# Patient Record
Sex: Female | Born: 1955 | Race: White | Hispanic: No | Smoking: Former smoker
Health system: Southern US, Community
[De-identification: ages and names within clinical notes are randomized; demographics above are authoritative.]

## PROBLEM LIST (undated history)

## (undated) VITALS — BP 114/72 | HR 87 | Temp 98.1°F | Resp 16 | Ht 65.0 in | Wt 193.0 lb

## (undated) DIAGNOSIS — I639 Cerebral infarction, unspecified: Secondary | ICD-10-CM

## (undated) DIAGNOSIS — F419 Anxiety disorder, unspecified: Secondary | ICD-10-CM

## (undated) DIAGNOSIS — F329 Major depressive disorder, single episode, unspecified: Secondary | ICD-10-CM

## (undated) DIAGNOSIS — F32A Depression, unspecified: Secondary | ICD-10-CM

## (undated) DIAGNOSIS — I10 Essential (primary) hypertension: Secondary | ICD-10-CM

## (undated) DIAGNOSIS — K259 Gastric ulcer, unspecified as acute or chronic, without hemorrhage or perforation: Secondary | ICD-10-CM

## (undated) DIAGNOSIS — E119 Type 2 diabetes mellitus without complications: Secondary | ICD-10-CM

## (undated) DIAGNOSIS — F1010 Alcohol abuse, uncomplicated: Secondary | ICD-10-CM

## (undated) DIAGNOSIS — R600 Localized edema: Secondary | ICD-10-CM

## (undated) HISTORY — PX: TONSILLECTOMY: SUR1361

## (undated) HISTORY — DX: Anxiety disorder, unspecified: F41.9

## (undated) HISTORY — PX: EYE SURGERY: SHX253

## (undated) HISTORY — DX: Alcohol abuse, uncomplicated: F10.10

## (undated) HISTORY — PX: TUBAL LIGATION: SHX77

## (undated) HISTORY — PX: BACK SURGERY: SHX140

## (undated) HISTORY — PX: GASTRIC BYPASS: SHX52

## (undated) HISTORY — PX: CHOLECYSTECTOMY: SHX55

---

## 2010-10-17 NOTE — Progress Notes (Signed)
Subjective:   Shirley Bass is a 55 y.o. female is s/p gastric bypass    Patient is here to have care resumed from a GBP that was done in 2008 by Dr. Mingo Amber.  Start wt 257 per pt  Total 92     She is exercising 3 times per week.    She is taking the post op recommended vitamins.    She is doing well and progressing nicely.    Daniel is eating 4 oz.   Satiety level is 5 hours.     Objective:   BP 116/68   Pulse 86   Temp 97.5 ??F (36.4 ??C)   Ht 5\' 5"  (1.651 m)   Wt 165 lb 8 oz (75.07 kg)   BMI 27.54 kg/m2   SpO2 98%   Estimated Body mass index is 27.54 kg/(m^2) as calculated from the following:    Height as of this encounter: 5\' 5" (1.651 m).    Weight as of this encounter: 165 lb 8 oz(75.07 kg).  Wt Readings from Last 3 Encounters:   10/17/10 165 lb 8 oz (75.07 kg)     Her change in weight since last visit: lost,  92 lbs.    Physical Examination: General appearance - alert, well appearing, and in no distress  Abdomen - soft, nontender, nondistended, no masses or organomegaly  scars from previous incisions well healed          Assessment/Plan:   Morbid Obesity, status gastric bypass surgery  Reviewed behaviors for compliance, including routine measuring of food volumes, Rarely or never eating until they are full.. The risk of pouch dilation secondary to this behavior was explained.      We will continue current treatment and  diet per protocol.  We have encourage attendance at support group and continuing an exercise program.  Will check labs  in 6 month(s)

## 2010-10-17 NOTE — Progress Notes (Signed)
Addended by: Raylene Miyamoto on: 10/17/2010 04:01 PM     Modules accepted: Level of Service

## 2010-10-17 NOTE — Patient Instructions (Signed)
An After Visit Summary was printed and given to the patient.

## 2010-10-17 NOTE — Progress Notes (Signed)
Patient is here to have care resumed from a GBP that was done in 2008 by Dr. Mingo Amber.  Start wt 257 per pt  Total 92

## 2011-08-20 LAB — CBC WITH AUTOMATED DIFF
ABS. BASOPHILS: 0 10*3/uL (ref 0.0–0.1)
ABS. EOSINOPHILS: 0.1 10*3/uL (ref 0.0–0.4)
ABS. LYMPHOCYTES: 2.9 10*3/uL (ref 0.8–3.5)
ABS. MONOCYTES: 0.7 10*3/uL (ref 0.0–1.0)
ABS. NEUTROPHILS: 5.6 10*3/uL (ref 1.8–8.0)
BASOPHILS: 0 % (ref 0–1)
EOSINOPHILS: 1 % (ref 0–7)
HCT: 49.1 % — ABNORMAL HIGH (ref 35.0–47.0)
HGB: 17.6 g/dL — ABNORMAL HIGH (ref 11.5–16.0)
LYMPHOCYTES: 31 % (ref 12–49)
MCH: 33.3 PG (ref 26.0–34.0)
MCHC: 35.8 g/dL (ref 30.0–36.5)
MCV: 92.8 FL (ref 80.0–99.0)
MONOCYTES: 7 % (ref 5–13)
NEUTROPHILS: 61 % (ref 32–75)
PLATELET: 298 10*3/uL (ref 150–400)
RBC: 5.29 M/uL — ABNORMAL HIGH (ref 3.80–5.20)
RDW: 12.6 % (ref 11.5–14.5)
WBC: 9.2 10*3/uL (ref 3.6–11.0)

## 2011-08-20 LAB — URINALYSIS W/ REFLEX CULTURE
Bacteria: NEGATIVE /HPF
Bilirubin: NEGATIVE
Blood: NEGATIVE
Glucose: 100 MG/DL — AB
Ketone: 15 MG/DL — AB
Leukocyte Esterase: NEGATIVE
Nitrites: NEGATIVE
Protein: NEGATIVE MG/DL
Specific gravity: 1.009 (ref 1.003–1.030)
Urobilinogen: 0.2 EU/DL (ref 0.2–1.0)
pH (UA): 5.5 (ref 5.0–8.0)

## 2011-08-20 LAB — METABOLIC PANEL, COMPREHENSIVE
A-G Ratio: 1 — ABNORMAL LOW (ref 1.1–2.2)
ALT (SGPT): 20 U/L (ref 12–78)
AST (SGOT): 15 U/L (ref 15–37)
Albumin: 4.3 g/dL (ref 3.5–5.0)
Alk. phosphatase: 101 U/L (ref 50–136)
Anion gap: 11 mmol/L (ref 5–15)
BUN/Creatinine ratio: 14 (ref 12–20)
BUN: 6 MG/DL (ref 6–20)
Bilirubin, total: 0.7 MG/DL (ref 0.2–1.0)
CO2: 23 MMOL/L (ref 21–32)
Calcium: 9.3 MG/DL (ref 8.5–10.1)
Chloride: 102 MMOL/L (ref 97–108)
Creatinine: 0.42 MG/DL — ABNORMAL LOW (ref 0.45–1.15)
GFR est AA: >60 mL/min/{1.73_m2} (ref >60)
GFR est non-AA: >60 mL/min/{1.73_m2} (ref >60)
Globulin: 4.2 g/dL — ABNORMAL HIGH (ref 2.0–4.0)
Glucose: 199 MG/DL — ABNORMAL HIGH (ref 65–100)
Potassium: 3.6 MMOL/L (ref 3.5–5.1)
Protein, total: 8.5 g/dL — ABNORMAL HIGH (ref 6.4–8.2)
Sodium: 136 MMOL/L (ref 136–145)

## 2011-08-20 LAB — AMYLASE: Amylase: 24 U/L — ABNORMAL LOW (ref 25–115)

## 2011-08-20 LAB — LIPASE: Lipase: 69 U/L — ABNORMAL LOW (ref 73–393)

## 2011-08-20 MED ORDER — ONDANSETRON HCL 8 MG TAB
8 mg | ORAL_TABLET | Freq: Three times a day (TID) | ORAL | Status: AC | PRN
Start: 2011-08-20 — End: ?

## 2011-08-20 MED ORDER — SODIUM CHLORIDE 0.9 % IJ SYRG
Freq: Once | INTRAMUSCULAR | Status: AC
Start: 2011-08-20 — End: 2011-08-20
  Administered 2011-08-20: 22:00:00 via INTRAVENOUS

## 2011-08-20 MED ORDER — SODIUM CHLORIDE 0.9% BOLUS IV
0.9 % | Freq: Once | INTRAVENOUS | Status: AC
Start: 2011-08-20 — End: 2011-08-20
  Administered 2011-08-20: 22:00:00 via INTRAVENOUS

## 2011-08-20 MED ORDER — HYDROCODONE-ACETAMINOPHEN 7.5 MG-325 MG TAB
ORAL_TABLET | Freq: Four times a day (QID) | ORAL | Status: AC | PRN
Start: 2011-08-20 — End: ?

## 2011-08-20 MED ORDER — IOVERSOL 350 MG/ML IV SOLN
350 mg iodine/mL | Freq: Once | INTRAVENOUS | Status: AC
Start: 2011-08-20 — End: 2011-08-20
  Administered 2011-08-20: 22:00:00 via INTRAVENOUS

## 2011-08-20 MED ORDER — ONDANSETRON 8 MG TAB, RAPID DISSOLVE
8 mg | ORAL_TABLET | Freq: Three times a day (TID) | ORAL | Status: AC | PRN
Start: 2011-08-20 — End: ?

## 2011-08-20 MED ADMIN — HYDROmorphone (DILAUDID) injection 2 mg: INTRAVENOUS | @ 23:00:00 | NDC 00641012121

## 2011-08-20 MED ADMIN — ondansetron (ZOFRAN) injection 8 mg: INTRAVENOUS | @ 21:00:00 | NDC 00409475503

## 2011-08-20 MED ADMIN — sodium chloride 0.9 % bolus infusion 1,000 mL: INTRAVENOUS | @ 21:00:00 | NDC 00409798309

## 2011-08-20 MED ADMIN — HYDROmorphone (PF) (DILAUDID) injection 1 mg: INTRAVENOUS | @ 21:00:00 | NDC 00409255201

## 2011-08-20 MED FILL — SODIUM CHLORIDE 0.9 % IV: INTRAVENOUS | Qty: 100

## 2011-08-20 MED FILL — OPTIRAY 350 MG IODINE/ML INTRAVENOUS SOLUTION: 350 mg iodine/mL | INTRAVENOUS | Qty: 100

## 2011-08-20 MED FILL — ONDANSETRON (PF) 4 MG/2 ML INJECTION: 4 mg/2 mL | INTRAMUSCULAR | Qty: 4

## 2011-08-20 MED FILL — HYDROMORPHONE 2 MG/ML INJECTION SOLUTION: 2 mg/mL | INTRAMUSCULAR | Qty: 1

## 2011-08-20 MED FILL — BD POSIFLUSH NORMAL SALINE 0.9 % INJECTION SYRINGE: INTRAMUSCULAR | Qty: 10

## 2011-08-20 MED FILL — SODIUM CHLORIDE 0.9 % IV: INTRAVENOUS | Qty: 1000

## 2011-08-20 MED FILL — HYDROMORPHONE (PF) 1 MG/ML IJ SOLN: 1 mg/mL | INTRAMUSCULAR | Qty: 1

## 2011-08-20 NOTE — ED Notes (Signed)
Bedside and Verbal shift change report given to Judeth Cornfield, Charity fundraiser (oncoming nurse) by Joselyn Glassman, RN (offgoing nurse).  Report given with SBAR, ED Summary, MAR and Recent Results. RN walked with pt to bathroom per request.

## 2011-08-20 NOTE — ED Notes (Signed)
Pt resting in bed in no apparent distress. Call bell within reach. Bed in low, locked position. Lights dimmed for comfort.

## 2011-08-20 NOTE — ED Notes (Signed)
Report received from Tyler, RN

## 2011-08-20 NOTE — ED Notes (Signed)
Pt states abdominal cramping & nausea. Pt ambulated to bathroom. Notified ER MD of pt complaints. ER MD has placed order for OTD zofran.

## 2011-08-20 NOTE — ED Notes (Signed)
Pt off floor to CT

## 2011-08-20 NOTE — ED Notes (Signed)
No change to patient assessment.  Denies pain at this time. Received discharge instructions from ER MD, verbalizes understanding.  Ambulatory upon discharge to waiting room to await ride from boyfriend.

## 2011-08-20 NOTE — ED Notes (Signed)
Pt resting in bed in no apparent distress. Call bell within reach. Bed in low, locked position.

## 2011-08-20 NOTE — ED Provider Notes (Signed)
HPI Comments: 56 y.o.female with PMH significant for diabetes, gastric bypass (4 years ago) and HTN presents with LUQ pain which radiates into left back.  The pt reports that her symptoms began at the beginning of the month and has been intermittent since its onsets.  She explained that when she wakes up in the morning the pain is not there but when she starts to "move around" the pain returns.  The pt was seen by her PCP Judge Stall, MD recently and she was told that she was constipated.  She started a regimen of laxatives with minimal relief of pain.  She explained that she is having regular BM's but her pain is continuing to get worse.  Her pain is exacerbated when she takes a deep breath. She also complains of worsening nausea over the last 2 days accompanied by loss of appetite.  Denies urinary symptoms, fever, chills, dyspnea, chest pain, melena, hematochezia or any other acute medical conditions.  Judge Stall, MD PCP.  Note written by Eliezer Champagne, Scribe, as dictated by Lovey Newcomer, MD 4:28 PM        The history is provided by the patient.        Past Medical History   Diagnosis Date   ??? Stool color black    ??? Depression    ??? Diabetes    ??? Hypertension         Past Surgical History   Procedure Date   ??? Hx orthopaedic    ??? Hx gastric bypass      2008   ??? Hx cesarean section    ??? Hx heent          Family History   Problem Relation Age of Onset   ??? Diabetes Mother    ??? Hypertension Mother    ??? Stroke Maternal Grandmother         History     Social History   ??? Marital Status: DIVORCED     Spouse Name: N/A     Number of Children: N/A   ??? Years of Education: N/A     Occupational History   ??? Not on file.     Social History Main Topics   ??? Smoking status: Former Smoker   ??? Smokeless tobacco: Not on file   ??? Alcohol Use: Yes   ??? Drug Use: No   ??? Sexually Active: Yes     Other Topics Concern   ??? Not on file     Social History Narrative   ??? No narrative on file                  ALLERGIES: Ambien and Sulfa  (sulfonamide antibiotics)      Review of Systems   Constitutional: Positive for appetite change. Negative for fever.   HENT: Negative for congestion and rhinorrhea.    Eyes: Negative for visual disturbance.   Respiratory: Negative for cough and shortness of breath.    Cardiovascular: Negative for chest pain.   Gastrointestinal: Positive for nausea and abdominal pain. Negative for vomiting and diarrhea.   Genitourinary: Negative for dysuria and hematuria.   Musculoskeletal: Positive for back pain.   Skin: Negative for rash.   Neurological: Negative for syncope, light-headedness and headaches.   Psychiatric/Behavioral: Negative for confusion.   All other systems reviewed and are negative.        Filed Vitals:    08/20/11 1528 08/20/11 1530   BP: 192/116 172/110  Pulse: 96 98   Temp: 98.4 ??F (36.9 ??C)    Resp: 20    Height: 5\' 5"  (1.651 m)    Weight: 79.379 kg (175 lb)    SpO2: 98%             Physical Exam   Nursing note and vitals reviewed.  Constitutional: She is oriented to person, place, and time. She appears well-developed and well-nourished. She appears distressed.   HENT:   Head: Normocephalic.   Eyes: Pupils are equal, round, and reactive to light.   Neck: Normal range of motion.   Cardiovascular: Normal rate and regular rhythm.    No murmur heard.  Pulmonary/Chest: Effort normal and breath sounds normal. No respiratory distress.   Abdominal: Soft. There is Tenderness: There is tenderness in both upper quadrants and epigastrium  with most tenderness in the left upper quadrant. .   Musculoskeletal: Normal range of motion. She exhibits no edema.   Neurological: She is alert and oriented to person, place, and time.   Skin: Skin is warm and dry.   Psychiatric: She has a normal mood and affect. Her behavior is normal.        MDM     Amount and/or Complexity of Data Reviewed:   Clinical lab tests:  Ordered and reviewed  Tests in the radiology section of CPT??:  Ordered and reviewed   Decide to obtain previous  medical records or to obtain history from someone other than the patient:  Yes   Review and summarize past medical records:  Yes   Discuss the patient with another provider:  Yes   Independant visualization of image, tracing, or specimen:  Yes  Risk of Significant Complications, Morbidity, and/or Mortality:   Presenting problems:  High  Diagnostic procedures:  High  Management options:  High      Procedures    Will obtain a CT for further evaluation of the pain and presenting complaints.   Will turn over to Dr. Mayford Knife pending this study.

## 2011-08-20 NOTE — ED Notes (Signed)
Pt ambulated to bathroom & returned to bed in no apparent distress.

## 2011-08-20 NOTE — ED Notes (Signed)
Pt returned to room in no apparent distress.

## 2011-08-20 NOTE — ED Notes (Signed)
Triage note: Patient arrives d/t LUQ pain radiating into left back, beginning at the beginning of July. Patient seen on Monday by PCP, diagnosed with constipation, patient reports taking bisacodyl, resulting in diarrhea.

## 2011-08-21 LAB — EKG, 12 LEAD, INITIAL
Atrial Rate: 89 {beats}/min
Calculated P Axis: 74 degrees
Calculated R Axis: 1 degrees
Calculated T Axis: 51 degrees
Diagnosis: NORMAL
P-R Interval: 142 ms
Q-T Interval: 370 ms
QRS Duration: 66 ms
QTC Calculation (Bezet): 450 ms
Ventricular Rate: 89 {beats}/min

## 2011-08-21 MED ADMIN — ondansetron (ZOFRAN ODT) tablet 8 mg: ORAL | NDC 68462015740

## 2011-08-21 MED FILL — ONDANSETRON 4 MG TAB, RAPID DISSOLVE: 4 mg | ORAL | Qty: 2

## 2013-06-16 ENCOUNTER — Encounter (HOSPITAL_COMMUNITY): Payer: Self-pay | Admitting: Emergency Medicine

## 2013-06-16 ENCOUNTER — Emergency Department (HOSPITAL_COMMUNITY)
Admission: EM | Admit: 2013-06-16 | Discharge: 2013-06-16 | Disposition: A | Discharge status: Other Acute Inpatient Hospital | Payer: BC Managed Care – PPO | Attending: Emergency Medicine | Admitting: Emergency Medicine

## 2013-06-16 ENCOUNTER — Encounter (HOSPITAL_COMMUNITY): Payer: Self-pay | Admitting: *Deleted

## 2013-06-16 ENCOUNTER — Inpatient Hospital Stay (HOSPITAL_COMMUNITY)
Admission: RE | Admit: 2013-06-16 | Discharge: 2013-06-21 | DRG: 885 | Disposition: A | Discharge status: Home / Self Care | Payer: BC Managed Care – PPO | Attending: Psychiatry | Admitting: Psychiatry

## 2013-06-16 DIAGNOSIS — R42 Dizziness and giddiness: Secondary | ICD-10-CM | POA: Insufficient documentation

## 2013-06-16 DIAGNOSIS — F329 Major depressive disorder, single episode, unspecified: Secondary | ICD-10-CM

## 2013-06-16 DIAGNOSIS — R197 Diarrhea, unspecified: Secondary | ICD-10-CM | POA: Insufficient documentation

## 2013-06-16 DIAGNOSIS — F411 Generalized anxiety disorder: Secondary | ICD-10-CM | POA: Diagnosis present

## 2013-06-16 DIAGNOSIS — Z5987 Material hardship due to limited financial resources, not elsewhere classified: Secondary | ICD-10-CM

## 2013-06-16 DIAGNOSIS — R1013 Epigastric pain: Secondary | ICD-10-CM | POA: Insufficient documentation

## 2013-06-16 DIAGNOSIS — F10129 Alcohol abuse with intoxication, unspecified: Secondary | ICD-10-CM | POA: Diagnosis present

## 2013-06-16 DIAGNOSIS — F3289 Other specified depressive episodes: Secondary | ICD-10-CM | POA: Insufficient documentation

## 2013-06-16 DIAGNOSIS — F32A Depression, unspecified: Secondary | ICD-10-CM

## 2013-06-16 DIAGNOSIS — K219 Gastro-esophageal reflux disease without esophagitis: Secondary | ICD-10-CM | POA: Diagnosis present

## 2013-06-16 DIAGNOSIS — E119 Type 2 diabetes mellitus without complications: Secondary | ICD-10-CM | POA: Insufficient documentation

## 2013-06-16 DIAGNOSIS — Z9884 Bariatric surgery status: Secondary | ICD-10-CM

## 2013-06-16 DIAGNOSIS — Z8673 Personal history of transient ischemic attack (TIA), and cerebral infarction without residual deficits: Secondary | ICD-10-CM | POA: Insufficient documentation

## 2013-06-16 DIAGNOSIS — E7800 Pure hypercholesterolemia, unspecified: Secondary | ICD-10-CM | POA: Diagnosis present

## 2013-06-16 DIAGNOSIS — F1020 Alcohol dependence, uncomplicated: Secondary | ICD-10-CM | POA: Diagnosis present

## 2013-06-16 DIAGNOSIS — R51 Headache: Secondary | ICD-10-CM | POA: Insufficient documentation

## 2013-06-16 DIAGNOSIS — Z87891 Personal history of nicotine dependence: Secondary | ICD-10-CM | POA: Insufficient documentation

## 2013-06-16 DIAGNOSIS — R45851 Suicidal ideations: Secondary | ICD-10-CM

## 2013-06-16 DIAGNOSIS — F332 Major depressive disorder, recurrent severe without psychotic features: Principal | ICD-10-CM | POA: Diagnosis present

## 2013-06-16 DIAGNOSIS — R112 Nausea with vomiting, unspecified: Secondary | ICD-10-CM | POA: Insufficient documentation

## 2013-06-16 DIAGNOSIS — K5900 Constipation, unspecified: Secondary | ICD-10-CM | POA: Diagnosis present

## 2013-06-16 DIAGNOSIS — Z8719 Personal history of other diseases of the digestive system: Secondary | ICD-10-CM | POA: Insufficient documentation

## 2013-06-16 DIAGNOSIS — R209 Unspecified disturbances of skin sensation: Secondary | ICD-10-CM | POA: Insufficient documentation

## 2013-06-16 DIAGNOSIS — R1012 Left upper quadrant pain: Secondary | ICD-10-CM | POA: Insufficient documentation

## 2013-06-16 DIAGNOSIS — G4700 Insomnia, unspecified: Secondary | ICD-10-CM | POA: Diagnosis present

## 2013-06-16 DIAGNOSIS — I10 Essential (primary) hypertension: Secondary | ICD-10-CM | POA: Diagnosis present

## 2013-06-16 DIAGNOSIS — Z598 Other problems related to housing and economic circumstances: Secondary | ICD-10-CM

## 2013-06-16 DIAGNOSIS — F39 Unspecified mood [affective] disorder: Secondary | ICD-10-CM | POA: Insufficient documentation

## 2013-06-16 HISTORY — DX: Major depressive disorder, single episode, unspecified: F32.9

## 2013-06-16 HISTORY — DX: Essential (primary) hypertension: I10

## 2013-06-16 HISTORY — DX: Gastric ulcer, unspecified as acute or chronic, without hemorrhage or perforation: K25.9

## 2013-06-16 HISTORY — DX: Depression, unspecified: F32.A

## 2013-06-16 HISTORY — DX: Type 2 diabetes mellitus without complications: E11.9

## 2013-06-16 HISTORY — DX: Cerebral infarction, unspecified: I63.9

## 2013-06-16 LAB — COMPREHENSIVE METABOLIC PANEL
ALBUMIN: 3.4 g/dL — AB (ref 3.5–5.2)
ALT: 51 U/L — ABNORMAL HIGH (ref 0–35)
AST: 92 U/L — ABNORMAL HIGH (ref 0–37)
Alkaline Phosphatase: 83 U/L (ref 39–117)
BILIRUBIN TOTAL: 0.5 mg/dL (ref 0.3–1.2)
BUN: 12 mg/dL (ref 6–23)
CO2: 24 mEq/L (ref 19–32)
CREATININE: 0.59 mg/dL (ref 0.50–1.10)
Calcium: 9.4 mg/dL (ref 8.4–10.5)
Chloride: 99 mEq/L (ref 96–112)
GFR calc Af Amer: >90 mL/min (ref >90)
GFR calc non Af Amer: >90 mL/min (ref >90)
Glucose, Bld: 231 mg/dL — ABNORMAL HIGH (ref 70–99)
Potassium: 3.9 mEq/L (ref 3.7–5.3)
Sodium: 138 mEq/L (ref 137–147)
Total Protein: 7 g/dL (ref 6.0–8.3)

## 2013-06-16 LAB — CBC
HCT: 40.1 % (ref 36.0–46.0)
Hemoglobin: 14 g/dL (ref 12.0–15.0)
MCH: 34.4 pg — ABNORMAL HIGH (ref 26.0–34.0)
MCHC: 34.9 g/dL (ref 30.0–36.0)
MCV: 98.5 fL (ref 78.0–100.0)
Platelets: 282 10*3/uL (ref 150–400)
RBC: 4.07 MIL/uL (ref 3.87–5.11)
RDW: 12.8 % (ref 11.5–15.5)
WBC: 6.3 10*3/uL (ref 4.0–10.5)

## 2013-06-16 LAB — ETHANOL: ALCOHOL ETHYL (B): 90 mg/dL — AB (ref 0–11)

## 2013-06-16 LAB — ACETAMINOPHEN LEVEL: Acetaminophen (Tylenol), Serum: <15 ug/mL (ref 10–30)

## 2013-06-16 LAB — RAPID URINE DRUG SCREEN, HOSP PERFORMED
AMPHETAMINES: NOT DETECTED
Barbiturates: NOT DETECTED
Benzodiazepines: NOT DETECTED
Cocaine: NOT DETECTED
Opiates: NOT DETECTED
Tetrahydrocannabinol: NOT DETECTED

## 2013-06-16 LAB — SALICYLATE LEVEL: Salicylate Lvl: <2 mg/dL — ABNORMAL LOW (ref 2.8–20.0)

## 2013-06-16 MED ORDER — LORAZEPAM 1 MG PO TABS: 1.0000 mg | ORAL_TABLET | Freq: Three times a day (TID) | ORAL | Status: DC | PRN

## 2013-06-16 MED ORDER — LORAZEPAM 1 MG PO TABS: 0.0000 mg | ORAL_TABLET | Freq: Four times a day (QID) | ORAL | Status: DC

## 2013-06-16 MED ORDER — IBUPROFEN 200 MG PO TABS: 600.0000 mg | ORAL_TABLET | Freq: Three times a day (TID) | ORAL | Status: DC | PRN

## 2013-06-16 MED ORDER — NICOTINE 21 MG/24HR TD PT24: 21.0000 mg | MEDICATED_PATCH | Freq: Every day | TRANSDERMAL | Status: DC

## 2013-06-16 MED ORDER — ZOLPIDEM TARTRATE 5 MG PO TABS: 5.0000 mg | ORAL_TABLET | Freq: Every evening | ORAL | Status: DC | PRN

## 2013-06-16 MED ORDER — VITAMIN B-1 100 MG PO TABS
100.0000 mg | ORAL_TABLET | Freq: Every day | ORAL | Status: DC
  Administered 2013-06-16: 100 mg via ORAL
  Filled 2013-06-16: qty 1

## 2013-06-16 MED ORDER — THIAMINE HCL 100 MG/ML IJ SOLN: 100.0000 mg | Freq: Every day | INTRAMUSCULAR | Status: DC

## 2013-06-16 MED ORDER — LORAZEPAM 1 MG PO TABS: 0.0000 mg | ORAL_TABLET | Freq: Two times a day (BID) | ORAL | Status: DC

## 2013-06-16 MED ORDER — ALUM & MAG HYDROXIDE-SIMETH 200-200-20 MG/5ML PO SUSP: 30.0000 mL | ORAL | Status: DC | PRN

## 2013-06-16 MED ORDER — ONDANSETRON HCL 4 MG PO TABS: 4.0000 mg | ORAL_TABLET | Freq: Three times a day (TID) | ORAL | Status: DC | PRN

## 2013-06-16 NOTE — ED Notes (Addendum)
Pt presents w/ increasing depression x "a few months."  Pt reports "the people I work with" as a factor for increasing depression.  Sts she takes antidepressants.   Denies SI/HI, but positive for hopelessness.  Sts "the world would be a better place w/o me."  Pt c/o chronic back pain, due to "sludge" in gallbladder.  Pain score 5/10.    [facility] per [facility].

## 2013-06-16 NOTE — BH Assessment (Signed)
[facility] Assessment Progress Note   This clinician [name] had been [facility] and could come on over to room 505-1.  Nurse Stanford BreedMacon was given information on contacting [facility] for transportation.  Patient did sign voluntary admission paperwork.  Dr. Radford PaxBeaton was notified so that patient could be transferred.

## 2013-06-16 NOTE — ED Provider Notes (Signed)
CSN: [id number]     Arrival date & time 06/16/13  1744 History  This chart was scribed for Trixie DredgeEmily Collan Schoenfeld, [place] working with Nelia Shiobert L Beaton, MD by Quintella ReichertMatthew Underwood, ED Scribe. This patient was seen in room WTR4/WLPT4 and the patient's care was started at 6:34 PM.   Chief Complaint  Patient presents with  . Medical Clearance    The history is provided by the patient. No language interpreter was used.    HPI Comments: Becky Villarreal is a 58 y.o. female who presents to the Emergency Department complaining of depression.  Pt admits to prior h/o less severe depression but states that over the past 6 months it has been worsening due to stress at work.  She states that over the past 6 months she has developed numerous new medical problems including HTN, HR over 100, and a stroke.  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  She denies SI or self-injury.  She has spoken about her depression with her Employee Assistance Program and was referred to "a woman named Santina EvansCatherine who yelled at me over the phone."  She has otherwise not sought treatment for her depression.  Pt also reports multiple recent physical symptoms due to her chronic medical conditions but denies any recent changes.  She reports a right-sided headache and left arm tingling due to her stroke which was diagnosed in January.  She also reports gait instability and intermittent vertigo.  In addition she reports frequent vomiting and diarrhea which she attributes to gallbladder issues.  This has been ongoing for the past 2 years, with no recent changes.  She has  a surgery planned for the end of July.  Currently she also reports constant nausea and abdominal pain due to her ulcer.  She denies fever, CP, SOB, cough, or urinary problems.  Pt notes that she took 400 units of insulin in the remote past but she woke up the next day and was not hospitalized.  She reports a family h/o depression including her mother and brothers.   Past Medical History  Diagnosis Date  . Depression   . Diabetes mellitus without complication   . Hypertension   . Stroke   . Gastric ulcer     Past Surgical History  Procedure Laterality Date  . Back surgery    . Cesarean section    . Tubal ligation    . Tonsillectomy    . Eye surgery      History reviewed. No pertinent family history.   History  Substance Use Topics  . Smoking status: Former Games developermoker  . Smokeless tobacco: Not on file  . Alcohol Use: Yes    OB History   Grav Para Term Preterm Abortions TAB SAB Ect Mult Living                   Review of Systems  Constitutional: Negative for fever.  Respiratory: Negative for cough and shortness of breath.   Cardiovascular: Negative for chest pain.  Gastrointestinal: Positive for nausea, vomiting, abdominal pain and diarrhea.  Genitourinary: Negative for dysuria, urgency, frequency,  hematuria, decreased urine volume, enuresis and difficulty urinating.  Neurological: Positive for dizziness, numbness (tingling in left arm) and headaches.  Psychiatric/Behavioral: Positive for dysphoric mood and decreased concentration. Negative for suicidal ideas and self-injury.  All other systems reviewed and are negative.     Allergies  Review of patient's allergies indicates not on file.  Home Medications   Prior to Admission medications   Not on File   BP 116/75  Pulse 103  Temp(Src) 98.1 F (36.7 C) (Oral)  Resp 16  SpO2 97%  Physical Exam  Nursing note and vitals reviewed. Constitutional: She appears well-developed and well-nourished. No distress.   HENT:  Head: Normocephalic and atraumatic.  Neck: Neck supple.  Cardiovascular: Normal rate, regular rhythm and intact distal pulses.   Pulmonary/Chest: Effort normal and breath sounds normal. No respiratory distress. She has no wheezes. She has no rales.  Abdominal: Soft. She exhibits no distension. There is tenderness. There is no rebound and no guarding.  RUQ and epigastric tenderness  Musculoskeletal: She exhibits no edema.  Neurological: She is alert.  Skin: She is not diaphoretic.    ED Course  Procedures (including critical care time)  DIAGNOSTIC STUDIES: Oxygen Saturation is 97% on room air, normal by my interpretation.    COORDINATION OF CARE: 6:43 PM-Discussed treatment plan which includes labs and behavioral health evaluation with pt at bedside and pt agreed to plan.     Labs Review Labs Reviewed  CBC - Abnormal; Notable for the following:    MCH 34.4 (*)    All other components within normal limits  COMPREHENSIVE METABOLIC PANEL - Abnormal; Notable for the following:    Glucose, Bld 231 (*)    Albumin 3.4 (*)    AST 92 (*)    ALT 51 (*)    All other components within normal limits  ETHANOL - Abnormal; Notable for the following:    Alcohol, Ethyl (B) 90 (*)    All other components within normal limits  SALICYLATE LEVEL - Abnormal; Notable for the following:    Salicylate Lvl <2.0 (*)    All other components within normal limits  ACETAMINOPHEN LEVEL  URINE RAPID DRUG SCREEN (HOSP PERFORMED)    Imaging Review No results found.   EKG Interpretation None      MDM   Final diagnoses:  Depression    Pt presenting with worsening depression.  No SI/HI.  Also mentions chronic gallbladder issues that are completely unchanged.  Doubt cholecystitis.  Awaiting psych assessment.  Holding orders placed.      I personally performed the services described in this documentation, which was scribed in my presence. The recorded information has been reviewed and is  accurate.    Trixie Dredgemily Dason Mosley, PA-C 06/16/13 2017

## 2013-06-16 NOTE — BH Assessment (Signed)
Assessment Note  Becky Villarreal is an 58 y.o. female that [facility]. She is accompanied by her boyfriend of 4 yrs. Patient sts that she is under a lot of stress at her job. 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 She reports that the staff at her job call her hostile, unapproachable, explosive, and hostile. Patient disagrees stating those words do not describe her character. She was also told that she would be demoted in addition to a pay cut. She is also stressed about  Patient denies suicidality but sts, "If I die I wouldn't really care". Patient is passively suicidal. She denies prior suicidal attempts/gestures. No hx of self mutilating behaviors. No hx of HI or AVH's. She denies drug use. Patient drinks heavily. She started drinking at age 58. She drinks 1-1.5 liters of wine per day. She reports drinking heavily for the past 5 yrs. Her last drink was 20-30 minutes prior to arrival 06/16/2013 for her [facility]S assessment. .  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 Her boyfriend shares that she drinks and drives, wakes up at 2am to drink prior to going in to work, drinks in public places such as [employer], and hides small bottles of liqour in her car or anywhere possible. Sts, "I have seen her bounce a check to get liqour".  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 His worse fear is that patient will get a DUI. Patient denies withdrawal symptoms. No hx of seizures or blackouts. Patient does not have a outpatient mental health provider. No hx of inpatient hospitalizations.  Axis I: Major Depression, Recurrent severe Axis II: Deferred Axis III:  Past Medical History  Diagnosis Date  . Depression   . Diabetes mellitus without complication   . Hypertension   . Stroke   . Gastric ulcer    Axis IV: other psychosocial  or environmental problems, problems related to social environment, problems with access to health care services and problems with primary support group Axis V: 31-40 impairment in reality testing  Past Medical History:  Past Medical History  Diagnosis Date  . Depression   . Diabetes mellitus without complication   . Hypertension   . Stroke   . Gastric ulcer     Past Surgical History  Procedure Laterality Date  . Back surgery    . Cesarean section    . Tubal ligation    . Tonsillectomy    . Eye surgery      Family History: No family history on file.  Social History:  reports that she has quit smoking. She does not have any smokeless tobacco history on file. She reports that she drinks alcohol. She reports that she does not use illicit drugs.  Additional Social History:     CIWA:   COWS:    Allergies: Allergies not on file  Home Medications:  (Not in a hospital admission)  OB/GYN Status:  No LMP recorded.  General Assessment Data Location of Assessment: [facility] Assessment Services Is this a Tele or Face-to-Face Assessment?: Tele Assessment Is this an Initial Assessment or a Re-assessment for this encounter?: Initial Assessment Living Arrangements: Other (Comment) (boyfriend of 2 yrs and 2 children) Can pt return to current living arrangement?: Yes Admission Status: Voluntary Is patient capable of signing voluntary admission?: Yes Transfer from: Acute  Hospital Referral Source: Self/Family/Friend     [facility] Crisis Care Plan Living Arrangements: Other (Comment) (boyfriend of 2 yrs and 2 children) Name of Psychiatrist:  (No psychiatrist ) Name of Therapist:  (No therapist )  Education Status Is patient currently in school?: No  Risk to self Suicidal Ideation: Yes-Currently Present Suicidal Intent: No Is patient at risk for suicide?: Yes Suicidal Plan?: No Access to Means: Yes Specify Access to Suicidal Means:  (Access to meds) What has been your use of  drugs/alcohol within the last 12 months?:  (patient denies alcohol and drug use) Previous Attempts/Gestures: No How many times?:  (0) Other Self Harm Risks:  (none reported ) Triggers for Past Attempts: Other (Comment) (none reported ) Intentional Self Injurious Behavior: None Family Suicide History: No Recent stressful life event(s): Other (Comment) (none reported ) Persecutory voices/beliefs?: No Depression: Yes Depression Symptoms: Feeling angry/irritable;Feeling worthless/self pity;Loss of interest in usual pleasures;Guilt;Fatigue;Tearfulness;Isolating;Despondent;Insomnia Substance abuse history and/or treatment for substance abuse?: No Suicide prevention information given to non-admitted patients: Yes  Risk to Others Homicidal Ideation: No Thoughts of Harm to Others: No Current Homicidal Intent: No Current Homicidal Plan: No Access to Homicidal Means: No Identified Victim:  (n/a) History of harm to others?: No Assessment of Violence: None Noted Violent Behavior Description:  (patient is calm and cooperative ) Does patient have access to weapons?: No Criminal Charges Pending?: No Does patient have a court date: No  Psychosis Hallucinations: None noted Delusions: None noted  Mental Status Report Appear/Hygiene: Disheveled Eye Contact: Good Motor Activity: Freedom of movement Speech: Logical/coherent Level of Consciousness: Alert Mood: Depressed Affect: Appropriate to circumstance Anxiety Level: None Thought Processes: Coherent;Relevant Judgement: Unimpaired Orientation: Person;Place;Situation;Time Obsessive Compulsive Thoughts/Behaviors: None  Cognitive Functioning Concentration: Decreased Memory: Recent Intact;Remote Intact IQ: Average Insight: Fair Impulse Control: Good Appetite: Poor (Pt sts, "I ) Weight Loss:  (none reported ) Weight Gain:  (none reported ) Sleep: No Change Vegetative Symptoms: None  ADLScreening [facility] Assessment Services) Patient's  cognitive ability adequate to safely complete daily activities?: Yes Patient able to express need for assistance with ADLs?: Yes Independently performs ADLs?: Yes (appropriate for developmental age)  Prior Inpatient Therapy Prior Inpatient Therapy: No Prior Therapy Dates:  (n/a) Prior Therapy Facilty/Provider(s):  (n/a)  Prior Outpatient Therapy Prior Outpatient Therapy: No Prior Therapy Dates:  (n/a) Prior Therapy Facilty/Provider(s):  (n/a)  ADL Screening (condition at time of admission) Patient's cognitive ability adequate to safely complete daily activities?: Yes Is the patient deaf or have difficulty hearing?: No Does the patient have difficulty seeing, even when wearing glasses/contacts?: No Does the patient have difficulty concentrating, remembering, or making decisions?: Yes Patient able to express need for assistance with ADLs?: Yes Does the patient have difficulty dressing or bathing?: No Independently performs ADLs?: Yes (appropriate for developmental age) Does the patient have difficulty walking or climbing stairs?: No Weakness of Legs: None Weakness of Arms/Hands: None  Home Assistive Devices/Equipment Home Assistive Devices/Equipment: None    Abuse/Neglect Assessment (Assessment to be complete while patient is alone) Physical Abuse: Denies Verbal Abuse: Denies Sexual Abuse: Denies Exploitation of patient/patient's resources: Denies Self-Neglect: Denies Values / Beliefs Cultural Requests During Hospitalization: None Spiritual Requests During Hospitalization: None   Advance Directives (For Healthcare) Advance Directive: Patient does not have advance directive Nutrition Screen- [facility] Adult/WL/AP Patient's home diet: Regular  Additional Information 1:1 In Past 12 Months?: No CIRT Risk: No Elopement Risk: Yes Does patient have medical clearance?: No     Disposition:  Disposition Initial Assessment Completed  for this Encounter: Yes Disposition of Patient:  Inpatient treatment program ([facility] Joya Salmori B., NP pending medical clearance) Type of inpatient treatment program: Adult  On Site Evaluation by:   Reviewed with Physician:    Octaviano Battyoyka Mona Shayne Diguglielmo 06/16/2013 6:11 PM

## 2013-06-16 NOTE — ED Notes (Signed)
CIWA <5, no ativan given at this time.  Pt denies smoking, nicotine not needed at this time.

## 2013-06-17 DIAGNOSIS — F10129 Alcohol abuse with intoxication, unspecified: Secondary | ICD-10-CM | POA: Diagnosis present

## 2013-06-17 DIAGNOSIS — F329 Major depressive disorder, single episode, unspecified: Secondary | ICD-10-CM | POA: Diagnosis present

## 2013-06-17 LAB — GLUCOSE, CAPILLARY: GLUCOSE-CAPILLARY: 203 mg/dL — AB (ref 70–99)

## 2013-06-17 MED ORDER — METFORMIN HCL 500 MG PO TABS
1000.0000 mg | ORAL_TABLET | Freq: Every day | ORAL | Status: DC
  Administered 2013-06-17 – 2013-06-21 (×5): 1000 mg via ORAL
  Filled 2013-06-17 (×7): qty 2

## 2013-06-17 MED ORDER — DULOXETINE HCL 30 MG PO CPEP
30.0000 mg | ORAL_CAPSULE | Freq: Every day | ORAL | Status: DC
  Administered 2013-06-17 – 2013-06-21 (×5): 30 mg via ORAL
  Filled 2013-06-17 (×4): qty 1
  Filled 2013-06-17: qty 3
  Filled 2013-06-17 (×2): qty 1

## 2013-06-17 MED ORDER — ALUM & MAG HYDROXIDE-SIMETH 200-200-20 MG/5ML PO SUSP: 30.0000 mL | ORAL | Status: DC | PRN

## 2013-06-17 MED ORDER — ESCITALOPRAM OXALATE 20 MG PO TABS
20.0000 mg | ORAL_TABLET | Freq: Every day | ORAL | Status: DC
  Administered 2013-06-17: 20 mg via ORAL
  Filled 2013-06-17 (×2): qty 1

## 2013-06-17 MED ORDER — PNEUMOCOCCAL VAC POLYVALENT 25 MCG/0.5ML IJ INJ
0.5000 mL | INJECTION | INTRAMUSCULAR | Status: AC
  Administered 2013-06-17: 0.5 mL via INTRAMUSCULAR

## 2013-06-17 MED ORDER — SUCRALFATE 1 GM/10ML PO SUSP
1.0000 g | Freq: Four times a day (QID) | ORAL | Status: DC
  Administered 2013-06-17 – 2013-06-21 (×18): 1 g via ORAL
  Filled 2013-06-17 (×25): qty 10

## 2013-06-17 MED ORDER — MAGNESIUM HYDROXIDE 400 MG/5ML PO SUSP
30.0000 mL | Freq: Every day | ORAL | Status: DC | PRN
Start: 2013-06-17 — End: 2013-06-21
  Administered 2013-06-20: 30 mL via ORAL

## 2013-06-17 MED ORDER — ACETAMINOPHEN 325 MG PO TABS
650.0000 mg | ORAL_TABLET | Freq: Four times a day (QID) | ORAL | Status: DC | PRN
Start: 2013-06-17 — End: 2013-06-21
  Administered 2013-06-20 – 2013-06-21 (×3): 650 mg via ORAL
  Filled 2013-06-17 (×3): qty 2

## 2013-06-17 MED ORDER — ASPIRIN 81 MG PO CHEW
81.0000 mg | CHEWABLE_TABLET | Freq: Every day | ORAL | Status: DC
  Administered 2013-06-17 – 2013-06-21 (×5): 81 mg via ORAL
  Filled 2013-06-17 (×7): qty 1

## 2013-06-17 MED ORDER — CHLORDIAZEPOXIDE HCL 25 MG PO CAPS: 25.0000 mg | ORAL_CAPSULE | Freq: Three times a day (TID) | ORAL | Status: DC | PRN

## 2013-06-17 MED ORDER — TRAZODONE HCL 50 MG PO TABS
50.0000 mg | ORAL_TABLET | Freq: Every day | ORAL | Status: DC
  Administered 2013-06-17 – 2013-06-19 (×3): 50 mg via ORAL
  Filled 2013-06-17 (×5): qty 1

## 2013-06-17 MED ORDER — ADULT MULTIVITAMIN W/MINERALS CH
1.0000 | ORAL_TABLET | Freq: Every day | ORAL | Status: DC
  Administered 2013-06-17 – 2013-06-21 (×5): 1 via ORAL
  Filled 2013-06-17 (×7): qty 1

## 2013-06-17 MED ORDER — SIMVASTATIN 20 MG PO TABS
20.0000 mg | ORAL_TABLET | Freq: Every day | ORAL | Status: DC
  Administered 2013-06-17 – 2013-06-20 (×4): 20 mg via ORAL
  Filled 2013-06-17 (×6): qty 1

## 2013-06-17 MED ORDER — GLIMEPIRIDE 4 MG PO TABS
4.0000 mg | ORAL_TABLET | Freq: Every day | ORAL | Status: DC
  Administered 2013-06-17 – 2013-06-21 (×5): 4 mg via ORAL
  Filled 2013-06-17 (×7): qty 1

## 2013-06-17 MED ORDER — BUPROPION HCL ER (XL) 300 MG PO TB24
300.0000 mg | ORAL_TABLET | Freq: Every day | ORAL | Status: DC
  Administered 2013-06-17: 300 mg via ORAL
  Filled 2013-06-17 (×2): qty 1

## 2013-06-17 MED ORDER — CARVEDILOL 3.125 MG PO TABS
3.1250 mg | ORAL_TABLET | Freq: Two times a day (BID) | ORAL | Status: DC
  Administered 2013-06-17 – 2013-06-21 (×9): 3.125 mg via ORAL
  Filled 2013-06-17 (×13): qty 1

## 2013-06-17 MED ORDER — AMLODIPINE BESYLATE 5 MG PO TABS
5.0000 mg | ORAL_TABLET | Freq: Every day | ORAL | Status: DC
  Administered 2013-06-17 – 2013-06-21 (×5): 5 mg via ORAL
  Filled 2013-06-17 (×7): qty 1

## 2013-06-17 NOTE — Tx Team (Signed)
Interdisciplinary Treatment Plan Update (Adult)  Date: 06/17/2013  Time Reviewed:  9:45 AM  Progress in Treatment: Attending groups: Yes Participating in groups:  Yes Taking medication as prescribed:  Yes Tolerating medication:  Yes Family/Significant othe contact made: CSW assessing  Patient understands diagnosis:  Yes Discussing patient identified problems/goals with staff:  Yes Medical problems stabilized or resolved:  Yes Denies suicidal/homicidal ideation: Yes Issues/concerns per patient self-inventory:  Yes Other:  New problem(s) identified: N/A  Discharge Plan or Barriers: CSW assessing for appropriate referrals.  Reason for Continuation of Hospitalization: Anxiety Depression Medication Stabilization  Comments: N/A  Estimated length of stay: 3-5 days  For review of initial/current patient goals, please see plan of care.  Attendees: Patient:     Family:     Physician:  Dr. Johnalagadda 06/17/2013 9:57 AM   Nursing:   Chris Judge, RN 06/17/2013 9:57 AM   Clinical Social Worker:  Darnell Stimson Horton, LCSW 06/17/2013 9:57 AM   Other: Beverly Knight, RN 06/17/2013 9:57 AM   Other:  Dawnaly Dax, RN 06/17/2013 9:57 AM   Other:  Jennifer Clark, case manager 06/17/2013 9:57 AM   Other:  Delora Sutton, care coordination 06/17/2013 9:58 AM   Other:    Other:    Other:    Other:    Other:    Other:     Scribe for Treatment Team:   Horton, Millee Denise Nicole, 06/17/2013 9:57 AM   

## 2013-06-17 NOTE — Progress Notes (Signed)
58 year old female pt admitted on voluntary basis. Pt reports that she is seeking help with depression and alcohol abuse. Pt reports that she began drinking approximately 4 1/2 years ago after her and ex-husband were unsuccessful in getting back together. Pt reports that she would have a glass of wine and over this time frame her usage has increased to the point where she drinks a bottle every day. Pt also reports having on-going issues at work and spoke about how she was recently demoted as she said her employer said she had explosive disorder. Pt also reports having numerous medical conditions that have contributed to her depression. Pt reports that she has a boyfriend who is supportive and whom she lives with and will return there after discharge. Pt denies any SI on admission and able to contract for safety on the unit. Pt was oriented to the unit and safety maintained.

## 2013-06-17 NOTE — [facility] Group Notes (Signed)
[facility] LCSW Group Therapy  06/17/2013  1:15 PM   Type of Therapy:  Group Therapy  Participation Level:  Active  Participation Quality:  Attentive, Sharing and Supportive  Affect:  Depressed and Flat  Cognitive:  Alert and Oriented  Insight:  Developing/Improving and Engaged  Engagement in Therapy:  Developing/Improving and Engaged  Modes of Intervention:  Clarification, Confrontation, Discussion, Education, Exploration, Limit-setting, Orientation, Problem-solving, Rapport Building, Dance movement psychotherapisteality Testing, Socialization and Support  Summary of Progress/Problems: The topic for today was feelings about relapse.  Pt discussed what relapse prevention is to them and identified triggers that they are on the path to relapse.  Pt processed their feeling towards relapse and was able to relate to peers.  Pt discussed coping skills that can be used for relapse prevention.   Pt was able to process the reason for admitting to the hospital.  Pt states that she's been dealing with depression for 15 years but recently has been self medicating with wine.  Pt states that she plans to treat her depression and rely on her faith to prevent future relapses.  Pt actively participated and was engaged in group discussion.    Reyes IvanChelsea Horton, LCSW 06/17/2013  3:02 PM

## 2013-06-17 NOTE — [facility] Suicide Risk Assessment (Signed)
[facility] INPATIENT:  Family/Significant Other Suicide Prevention Education  Suicide Prevention Education:  Education Completed; Radene JourneyCurtis Baker - boyfriend [phone]),  (name of family member/significant other) has been identified by the patient as the family member/significant other with whom the patient will be residing, and identified as the person(s) who will aid the patient in the event of a mental health crisis (suicidal ideations/suicide attempt).  With written consent from the patient, the family member/significant other has been provided the following suicide prevention education, prior to the and/or following the discharge of the patient.  The suicide prevention education provided includes the following:  Suicide risk factors  Suicide prevention and interventions  National Suicide Hotline telephone number  [facility] assessment telephone number  [facility] Assistance 911  [facility] and/or Residential Mobile Crisis Unit telephone number  Request made of family/significant other to:  Remove weapons (e.g., guns, rifles, knives), all items previously/currently identified as safety concern.    Remove drugs/medications (over-the-counter, prescriptions, illicit drugs), all items previously/currently identified as a safety concern.  The family member/significant other verbalizes understanding of the suicide prevention education information provided.  The family member/significant other agrees to remove the items of safety concern listed above.  Sharni Negron N Horton 06/17/2013, 2:53 PM

## 2013-06-17 NOTE — [facility] Group Notes (Signed)
[facility]   06/17/2013  8:45 AM  Participation Quality:  Did Not Attend - pt meeting with MD  Reyes Ivanhelsea Horton, LCSW 06/17/2013 10:34 AM

## 2013-06-17 NOTE — Progress Notes (Signed)
D: Pt denies SI/HI/AVH. Pt is pleasant and cooperative. Pt stated her drinking got out of hand. Work stressors between her supervisor and her subordinates added to the stress to cause the pt to increase her drinking. Pt feels sad about [place] job, and being demoted. Pt feel optimistic, stating she quit smoking 3pks a day 20 yrs ago, she feels she can beat this drinking.  A: Pt was offered support and encouragement. Pt was given scheduled medications. Pt was encourage to attend groups. Q 15 minute checks were done for safety.   R:Pt attends groups and interacts well with peers and staff. Pt is taking medication. Pt has no complaints at this time.Pt receptive to treatment and safety maintained on unit.

## 2013-06-17 NOTE — H&P (Signed)
Psychiatric Admission Assessment Adult  Patient Identification:  Becky Villarreal Date of Evaluation:  06/17/2013 Chief Complaint:  MAJOR DEPRESSIVE DISORDER  History of Present Illness: Becky Villarreal is an 58 y.o. female likes to be called Becky Villarreal admitted voluntarily from [facility] as a walk in and is accompanied by her boyfriend of 4 yrs. Patient presents with severe symptoms of depression, anxiety and alcohol abuse vs dependence. She has increased levels of liver enzymes, demoted from job and is under a lot of stress at her job. 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 She has been struggling with her emotions, irritable, easily getting frustrated and staff at her job call her hostile, unapproachable, explosive, and hostile. Patient disagrees stating those words do not describe her character, which is calm and cooperative. She was also told that she would be demoted in addition to a pay cut. She has suicidal thoughts but denied suicidal intent and plans. She has one previous suicidal attempt and states "If I die I wouldn't really care". She was used to smoke tobacco 3PPD which she was able to stop several years ago. She started drinking at age 35. She drinks 1-1.5 liters of wine per day. She reports drinking heavily for the past 5 yrs. Her last drink was 20-30 minutes prior to arrival 06/16/2013. 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 She drinks and drives, wakes up at 2am to drink prior to going in to work, drinks in public places such as [employer], and hides small bottles of liqour in her car or anywhere possible. Sts, "I have seen her bounce a check to get liqour". 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 His worse fear is that  patient will get a DUI. Patient denies withdrawal symptoms at this time but reports severe cravings and she has no previous treatment. She has no hx of seizures but report multiple blackouts. Patient does not have a outpatient mental health provider. No hx of inpatient hospitalizations.  Elements:  Location:  depression. Quality:  poor. Severity:  suicidal thoughts. Timing:  psychosocial stresses and work problems. Associated Signs/Synptoms: Depression Symptoms:  depressed mood, anhedonia, insomnia, psychomotor retardation, fatigue, feelings of worthlessness/guilt, difficulty concentrating, hopelessness, impaired memory, recurrent thoughts of death, disturbed sleep, weight gain, decreased labido, decreased appetite, (Hypo) Manic Symptoms:  Distractibility, Impulsivity, Anxiety Symptoms:  Excessive Worry, Psychotic Symptoms:  denied PTSD Symptoms: NA Total Time spent with patient: 45 minutes  Psychiatric Specialty Exam: Physical Exam  ROS  Blood pressure 128/85, pulse 109, temperature 97.5 F (36.4 C), temperature source Oral, resp. rate 18, height 5' 5"  (1.651 m), weight 87.544 kg (193 lb).Body mass index is 32.12 kg/(m^2).  General Appearance: Casual  Eye Contact::  Fair  Speech:  Clear and Coherent and Slow  Volume:  Decreased  Mood:  Anxious, Depressed and Dysphoric  Affect:  Depressed, Flat and Tearful  Thought Process:  Coherent and Goal Directed  Orientation:  Full (Time, Place, and Person)  Thought Content:  Rumination  Suicidal Thoughts:  Yes.  without intent/plan  Homicidal Thoughts:  No  Memory:  Immediate;   Fair  Judgement:  Impaired  Insight:  Lacking  Psychomotor Activity:  Psychomotor Retardation  Concentration:  Poor  Recall:  Fair  Fund of Knowledge:Good  Language: Good  Akathisia:  NA  Handed:  Right  AIMS (if indicated):     Assets:  Communication Skills Desire for Improvement Financial Resources/Insurance Housing Intimacy Leisure  Time [facility] Talents/Skills Transportation  Sleep:  Number of Hours: 6    Musculoskeletal: Strength & Muscle Tone: within normal limits Gait & Station: normal Patient leans: N/A  Past Psychiatric History: Diagnosis:  Hospitalizations:  Outpatient Care:  Substance Abuse Care:  Self-Mutilation:  Suicidal Attempts:  Violent Behaviors:   Past Medical History:   Past Medical History  Diagnosis Date  . Depression   . Diabetes mellitus without complication   . Hypertension   . Stroke   . Gastric ulcer    None. Allergies:   Allergies  Allergen Reactions  . Sulfa Antibiotics Anaphylaxis  . Ambien [Zolpidem Tartrate] Other (See Comments)    Causes memory loss   PTA Medications: Prescriptions prior to admission  Medication Sig Dispense Refill  . amLODipine (NORVASC) 5 MG tablet Take 5 mg by mouth daily.      Marland Kitchen aspirin 81 MG chewable tablet Chew 81 mg by mouth daily.      . Biotin 5000 MCG CAPS Take 1 capsule by mouth 2 (two) times daily.      Marland Kitchen buPROPion (WELLBUTRIN XL) 300 MG 24 hr tablet Take 300 mg by mouth daily.      . Calcium Carbonate-Vitamin D (CALTRATE 600+D PO) Take 1 tablet by mouth daily.      . carvedilol (COREG) 3.125 MG tablet Take 3.125 mg by mouth 2 (two) times daily with a meal.      . Cyanocobalamin (VITAMIN B 12 PO) Take 1 tablet by mouth 2 (two) times daily.      Marland Kitchen escitalopram (LEXAPRO) 20 MG tablet Take 20 mg by mouth daily.      Marland Kitchen glimepiride (AMARYL) 4 MG tablet Take 4 mg by mouth daily.      Marland Kitchen LORazepam (ATIVAN) 0.5 MG tablet Take 0.5 mg by mouth every 8 (eight) hours as needed for anxiety.      . metFORMIN (GLUCOPHAGE) 1000 MG tablet Take 1,000 mg by mouth daily with breakfast.      . Multiple Vitamin (MULTIVITAMIN WITH MINERALS) TABS tablet Take 1 tablet by mouth daily.      . ondansetron (ZOFRAN) 4 MG tablet Take 4 mg by mouth every 6 (six) hours as needed for nausea or vomiting.      . simvastatin (ZOCOR) 20  MG tablet Take 20 mg by mouth daily.      . sucralfate (CARAFATE) 1 GM/10ML suspension Take 1 g by mouth 4 (four) times daily.        Previous Psychotropic Medications:  Medication/Dose  wellbutrin  lexapro             Substance Abuse History in the last 12 months:  yes  Consequences of Substance Abuse: Medical Consequences:  elevated liver enzymes Blackouts:    Social History:  reports that she has quit smoking. She does not have any smokeless tobacco history on file. She reports that she drinks alcohol. She reports that she does not use illicit drugs. Additional Social History:   Name of Substance 1: Alcohol  1 - Age of First Use: 58 yrs old  1 - Amount (size/oz): 1-1.5 liters of wine 1 - Frequency: daily  1 - Duration: 4-5 yrs  1 - Last Use / Amount: 06/16/2013  Current Place of Residence:   Place of Birth:   Family Members: Marital Status:  Single Children:  Sons:  Daughters: Relationships: Education:  Dentist Problems/Performance: Religious Beliefs/Practices: History of Abuse (Emotional/Phsycial/Sexual) Ship broker History:  None. Legal History: Hobbies/Interests:  Family History:  History reviewed. No pertinent family history.  Results for orders placed during the hospital encounter of 06/16/13 (from the past 72 hour(s))  URINE RAPID DRUG SCREEN (HOSP PERFORMED)     Status: None   Collection Time    06/16/13  6:33 PM      Result Value Ref Range   Opiates NONE DETECTED  NONE DETECTED   Cocaine NONE DETECTED  NONE DETECTED   Benzodiazepines NONE DETECTED  NONE DETECTED   Amphetamines NONE DETECTED  NONE DETECTED   Tetrahydrocannabinol NONE DETECTED  NONE DETECTED   Barbiturates NONE DETECTED  NONE DETECTED   Comment:            DRUG SCREEN FOR MEDICAL PURPOSES     ONLY.  IF CONFIRMATION IS NEEDED     FOR ANY PURPOSE, NOTIFY LAB     WITHIN 5 DAYS.                LOWEST DETECTABLE LIMITS      FOR URINE DRUG SCREEN     Drug Class       Cutoff (ng/mL)     Amphetamine      1000     Barbiturate      200     Benzodiazepine   268     Tricyclics       341     Opiates          300     Cocaine          300     THC              50  ACETAMINOPHEN LEVEL     Status: None   Collection Time    06/16/13  6:35 PM      Result Value Ref Range   Acetaminophen (Tylenol), Serum <15.0  10 - 30 ug/mL   Comment:            THERAPEUTIC CONCENTRATIONS VARY     SIGNIFICANTLY. A RANGE OF 10-30     ug/mL MAY BE AN EFFECTIVE     CONCENTRATION FOR MANY PATIENTS.     HOWEVER, SOME ARE BEST TREATED     AT CONCENTRATIONS OUTSIDE THIS     RANGE.     ACETAMINOPHEN CONCENTRATIONS     >150 ug/mL AT 4 HOURS AFTER     INGESTION AND >50 ug/mL AT 12     HOURS AFTER INGESTION ARE     OFTEN ASSOCIATED WITH TOXIC     REACTIONS.  CBC     Status: Abnormal   Collection Time    06/16/13  6:35 PM      Result Value Ref Range   WBC 6.3  4.0 - 10.5 K/uL   Comment: WHITE COUNT CONFIRMED ON SMEAR   RBC 4.07  3.87 - 5.11 MIL/uL   Hemoglobin 14.0  12.0 - 15.0 g/dL   HCT 40.1  36.0 - 46.0 %   MCV 98.5  78.0 - 100.0 fL   MCH 34.4 (*) 26.0 - 34.0 pg   MCHC 34.9  30.0 - 36.0 g/dL   RDW 12.8  11.5 - 15.5 %   Platelets 282  150 - 400 K/uL  COMPREHENSIVE METABOLIC PANEL  Status: Abnormal   Collection Time    06/16/13  6:35 PM      Result Value Ref Range   Sodium 138  137 - 147 mEq/L   Potassium 3.9  3.7 - 5.3 mEq/L   Chloride 99  96 - 112 mEq/L   CO2 24  19 - 32 mEq/L   Glucose, Bld 231 (*) 70 - 99 mg/dL   BUN 12  6 - 23 mg/dL   Creatinine, Ser 0.59  0.50 - 1.10 mg/dL   Calcium 9.4  8.4 - 10.5 mg/dL   Total Protein 7.0  6.0 - 8.3 g/dL   Albumin 3.4 (*) 3.5 - 5.2 g/dL   AST 92 (*) 0 - 37 U/L   ALT 51 (*) 0 - 35 U/L   Alkaline Phosphatase 83  39 - 117 U/L   Total Bilirubin 0.5  0.3 - 1.2 mg/dL   GFR calc non Af Amer >90  >90 mL/min   GFR calc Af Amer >90  >90 mL/min   Comment: (NOTE)     The eGFR has  been calculated using the CKD EPI equation.     This calculation has not been validated in all clinical situations.     eGFR's persistently <90 mL/min signify possible Chronic Kidney     Disease.  ETHANOL     Status: Abnormal   Collection Time    06/16/13  6:35 PM      Result Value Ref Range   Alcohol, Ethyl (B) 90 (*) 0 - 11 mg/dL   Comment:            LOWEST DETECTABLE LIMIT FOR     SERUM ALCOHOL IS 11 mg/dL     FOR MEDICAL PURPOSES ONLY  SALICYLATE LEVEL     Status: Abnormal   Collection Time    06/16/13  6:35 PM      Result Value Ref Range   Salicylate Lvl <1.3 (*) 2.8 - 20.0 mg/dL   Psychological Evaluations:  Assessment:   DSM5:  Schizophrenia Disorders:   Obsessive-Compulsive Disorders:   Trauma-Stressor Disorders:   Substance/Addictive Disorders:   Depressive Disorders:    AXIS I:  Major Depression, Recurrent severe and Substance Abuse AXIS II:  Deferred AXIS III:   Past Medical History  Diagnosis Date  . Depression   . Diabetes mellitus without complication   . Hypertension   . Stroke   . Gastric ulcer    AXIS IV:  economic problems, occupational problems, other psychosocial or environmental problems, problems related to social environment and problems with primary support group AXIS V:  41-50 serious symptoms  Treatment Plan/Recommendations:  Admit for crisis stabilization  Treatment Plan Summary: Daily contact with patient to assess and evaluate symptoms and progress in treatment Medication management Current Medications:  Current Facility-Administered Medications  Medication Dose Route Frequency Provider Last Rate Last Dose  . acetaminophen (TYLENOL) tablet 650 mg  650 mg Oral Q6H PRN Lurena Nida, NP      . alum & mag hydroxide-simeth (MAALOX/MYLANTA) 200-200-20 MG/5ML suspension 30 mL  30 mL Oral Q4H PRN Lurena Nida, NP      . amLODipine (NORVASC) tablet 5 mg  5 mg Oral Daily Lurena Nida, NP   5 mg at 06/17/13 0819  . aspirin chewable tablet  81 mg  81 mg Oral Daily Lurena Nida, NP   81 mg at 06/17/13 0818  . buPROPion (WELLBUTRIN XL) 24 hr tablet 300 mg  300 mg Oral Daily Manus Gunning  Barrett Henle, NP   300 mg at 06/17/13 0818  . carvedilol (COREG) tablet 3.125 mg  3.125 mg Oral BID WC Lurena Nida, NP   3.125 mg at 06/17/13 0818  . escitalopram (LEXAPRO) tablet 20 mg  20 mg Oral Daily Lurena Nida, NP   20 mg at 06/17/13 0818  . glimepiride (AMARYL) tablet 4 mg  4 mg Oral Daily Lurena Nida, NP   4 mg at 06/17/13 0818  . magnesium hydroxide (MILK OF MAGNESIA) suspension 30 mL  30 mL Oral Daily PRN Lurena Nida, NP      . metFORMIN (GLUCOPHAGE) tablet 1,000 mg  1,000 mg Oral Q breakfast Lurena Nida, NP   1,000 mg at 06/17/13 0818  . multivitamin with minerals tablet 1 tablet  1 tablet Oral Daily Lurena Nida, NP   1 tablet at 06/17/13 0818  . pneumococcal 23 valent vaccine (PNU-IMMUNE) injection 0.5 mL  0.5 mL Intramuscular Tomorrow-1000 Durward Parcel, MD      . simvastatin (ZOCOR) tablet 20 mg  20 mg Oral q1800 Lurena Nida, NP      . sucralfate (CARAFATE) 1 GM/10ML suspension 1 g  1 g Oral QID Lurena Nida, NP   1 g at 06/17/13 7209    Observation Level/Precautions:  15 minute checks  Laboratory:  Reviewed admission labs and check TSH and FREE T3  Psychotherapy:  Group and milieu therapy and substance abuse counseling  Medications:  Start Cymbalta 30 mg Qd and discontinue wellbutrin and lexapro which are not working, Librium and Trazodone PRN  Consultations:  none  Discharge Concerns:  safety  Estimated LOS: 4-5 days  Other:     I certify that inpatient services furnished can reasonably be expected to improve the patient's condition.   Parke Simmers Savera Donson 5/15/20159:23 AM

## 2013-06-17 NOTE — [facility] Suicide Risk Assessment (Signed)
Suicide Risk Assessment  Admission Assessment     Nursing information obtained from:    Demographic factors:    Current Mental Status:    Loss Factors:    Historical Factors:    Risk Reduction Factors:    Total Time spent with patient: 45 minutes  CLINICAL FACTORS:   Depression:   Anhedonia Comorbid alcohol abuse/dependence Hopelessness Impulsivity Insomnia Recent sense of peace/wellbeing Severe Alcohol/Substance Abuse/Dependencies Unstable or Poor Therapeutic Relationship Previous Psychiatric Diagnoses and Treatments Medical Diagnoses and Treatments/Surgeries  COGNITIVE FEATURES THAT CONTRIBUTE TO RISK:  Closed-mindedness Loss of executive function Polarized thinking    SUICIDE RISK:   Moderate:  Frequent suicidal ideation with limited intensity, and duration, some specificity in terms of plans, no associated intent, good self-control, limited dysphoria/symptomatology, some risk factors present, and identifiable protective factors, including available and accessible social support.  PLAN OF CARE: Admit for depression and suicidal thoughts and alcohol abuse and has history of suicidal attempt by overdose on Insuline in the year 2000.  I certify that inpatient services furnished can reasonably be expected to improve the patient's condition.   Becky Villarreal 06/17/2013, 9:16 AM

## 2013-06-17 NOTE — Progress Notes (Signed)
Adult Psychoeducational Group Note  Date:  06/17/2013 Time:  9:06 PM  Group Topic/Focus:  Wrap-Up Group:   The focus of this group is to help patients review their daily goal of treatment and discuss progress on daily workbooks.  Participation Level:  Active  Participation Quality:  Appropriate and Attentive  Affect:  Appropriate  Cognitive:  Appropriate  Insight: Good  Engagement in Group:  Engaged  Modes of Intervention:  Discussion, Exploration, Socialization and Support  Additional Comments:  Pt came to group and shared that she had been without her wine for a few days now and she feels really great about that and plans on staying sober. Pt received positive support from the group.   Becky Villarreal 06/17/2013, 9:06 PM

## 2013-06-17 NOTE — Progress Notes (Signed)
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 Feels that her self esteem wasn't all that great to start with, but now Pt is also quite depressed. Rates her depression at an 8 and her hopelessness at a 9. Denies SI and HI. States there was also a drinking problem which has increased over the year. Affect is flat and mood is depressed. Pt states that she wants to feel better. A) Given support, reassurance and praise. Encouragement along with praise given. Encoruaged to attend all the groups and to get all she could out of the program. R) Denies SI and HI.
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
## 2013-06-18 LAB — LIPID PANEL
CHOL/HDL RATIO: 3.2 ratio
CHOLESTEROL: 145 mg/dL (ref 0–200)
HDL: 46 mg/dL (ref >39)
LDL CALC: 32 mg/dL (ref 0–99)
Triglycerides: 333 mg/dL — ABNORMAL HIGH (ref <150)
VLDL: 67 mg/dL — AB (ref 0–40)

## 2013-06-18 LAB — TSH: TSH: 5.41 u[IU]/mL — ABNORMAL HIGH (ref 0.350–4.500)

## 2013-06-18 LAB — T3, FREE: T3, Free: 2.8 pg/mL (ref 2.3–4.2)

## 2013-06-18 MED ORDER — SENNOSIDES-DOCUSATE SODIUM 8.6-50 MG PO TABS
1.0000 | ORAL_TABLET | Freq: Every evening | ORAL | Status: DC | PRN
  Administered 2013-06-18 – 2013-06-20 (×2): 1 via ORAL
  Filled 2013-06-18 (×2): qty 1

## 2013-06-18 NOTE — Progress Notes (Signed)
[facility]  06/18/2013 4:54 PM Becky Villarreal  MRN:  [id number] Subjective:   Patient states "I had some trouble sleeping. But my roommate was up a lot last night. I was drinking so much before I came. A very stressful job really got the best of me. I feel more hopeful. I was down so low. I am glad I am here for help. I want to attend IOP after I leave here for a few weeks."   Objective:  Patient is visible on the unit and attending the scheduled groups. She reports decreasing symptoms of depression. Rates it at a five today but reports it was nine prior to admission. The patient is now showing insight into her mental health problems. Patient admits to abusing alcohol to deal with life stressors. 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 She reports knowing now that she is not able to handle alcohol and plans to stay away from it. Patient is complaint with medications and denies any adverse effects.   Diagnosis:   DSM5: Total Time spent with patient: 30 minutes Schizophrenia Disorders:  Obsessive-Compulsive Disorders:  Trauma-Stressor Disorders:  Substance/Addictive Disorders:  Depressive Disorders:  AXIS I: Major Depression, Recurrent severe and Substance Abuse  AXIS II: Deferred  AXIS III:  Past Medical History   Diagnosis  Date   .  Depression    .  Diabetes mellitus without complication    .  Hypertension    .  Stroke    .  Gastric ulcer     AXIS IV: economic problems, occupational problems, other psychosocial or environmental problems, problems related to social environment and problems with primary support group  AXIS V: 41-50 serious symptoms   ADL's:  Intact  Sleep: Fair  Appetite:  Good  Suicidal Ideation:  Passive SI no plan Homicidal Ideation:  Denies AEB (as evidenced by):  Psychiatric Specialty Exam: Physical Exam  Review of Systems  Constitutional: Negative.   HENT: Negative.   Eyes: Negative.   Respiratory:  Negative.   Cardiovascular: Negative.   Gastrointestinal: Negative.   Genitourinary: Negative.   Musculoskeletal: Negative.   Skin: Negative.   Neurological: Negative.   Endo/Heme/Allergies: Negative.   Psychiatric/Behavioral: Positive for depression, suicidal ideas and substance abuse. The patient is nervous/anxious and has insomnia.     Blood pressure 127/75, pulse 87, temperature 97.8 F (36.6 C), temperature source Oral, resp. rate 20, height 5\' 5"  (1.651 m), weight 87.544 kg (193 lb).Body mass index is 32.12 kg/(m^2).  General Appearance: Casual  Eye Contact::  Good  Speech:  Clear and Coherent  Volume:  Normal  Mood:  Anxious and Dysphoric  Affect:  Congruent  Thought Process:  Goal Directed and Intact  Orientation:  Full (Time, Place, and Person)  Thought Content:  Rumination  Suicidal Thoughts:  Yes.  without intent/plan  Homicidal Thoughts:  No  Memory:  Immediate;   Good Recent;   Good Remote;   Good  Judgement:  Impaired  Insight:  Shallow  Psychomotor Activity:  Normal  Concentration:  Fair  Recall:  Fair  Fund of Knowledge:Good  Language: Good  Akathisia:  No  Handed:  Right  AIMS (if indicated):     Assets:  Communication Skills Desire for Improvement Financial Resources/Insurance Leisure Time Physical Health Resilience Social Support Talents/Skills Vocational/Educational  Sleep:  Number of Hours: 6   Musculoskeletal: Strength & Muscle Tone: within normal limits Gait & Station: normal Patient leans: N/A  Current Medications: Current Facility-Administered Medications  Medication Dose Route Frequency Provider Last Rate Last Dose  . acetaminophen (TYLENOL) tablet 650 mg  650 mg Oral Q6H PRN Kristeen Mans, NP      . alum & mag hydroxide-simeth (MAALOX/MYLANTA) 200-200-20 MG/5ML suspension 30 mL  30 mL Oral Q4H PRN Kristeen Mans, NP      . amLODipine (NORVASC) tablet 5 mg  5 mg Oral Daily Kristeen Mans, NP   5 mg at 06/18/13 1610  . aspirin chewable  tablet 81 mg  81 mg Oral Daily Kristeen Mans, NP   81 mg at 06/18/13 9604  . carvedilol (COREG) tablet 3.125 mg  3.125 mg Oral BID WC Kristeen Mans, NP   3.125 mg at 06/18/13 5409  . chlordiazePOXIDE (LIBRIUM) capsule 25 mg  25 mg Oral TID PRN Nehemiah Settle, MD      . DULoxetine (CYMBALTA) DR capsule 30 mg  30 mg Oral Daily Nehemiah Settle, MD   30 mg at 06/18/13 8119  . glimepiride (AMARYL) tablet 4 mg  4 mg Oral Daily Kristeen Mans, NP   4 mg at 06/18/13 1478  . magnesium hydroxide (MILK OF MAGNESIA) suspension 30 mL  30 mL Oral Daily PRN Kristeen Mans, NP      . metFORMIN (GLUCOPHAGE) tablet 1,000 mg  1,000 mg Oral Q breakfast Kristeen Mans, NP   1,000 mg at 06/18/13 2956  . multivitamin with minerals tablet 1 tablet  1 tablet Oral Daily Kristeen Mans, NP   1 tablet at 06/18/13 [phone]  . simvastatin (ZOCOR) tablet 20 mg  20 mg Oral q1800 Kristeen Mans, NP   20 mg at 06/17/13 1715  . sucralfate (CARAFATE) 1 GM/10ML suspension 1 g  1 g Oral QID Kristeen Mans, NP   1 g at 06/18/13 1203  . traZODone (DESYREL) tablet 50 mg  50 mg Oral QHS Nehemiah Settle, MD   50 mg at 06/17/13 2130    Lab Results:  Results for orders placed during the hospital encounter of 06/16/13 (from the past 48 hour(s))  GLUCOSE, CAPILLARY     Status: Abnormal   Collection Time    06/17/13  5:03 PM      Result Value Ref Range   Glucose-Capillary 203 (*) 70 - 99 mg/dL  TSH     Status: Abnormal   Collection Time    06/18/13  6:35 AM      Result Value Ref Range   TSH 5.410 (*) 0.350 - 4.500 uIU/mL   Comment: Please note change in reference range.     Performed at [facility]  LIPID PANEL     Status: Abnormal   Collection Time    06/18/13  6:35 AM      Result Value Ref Range   Cholesterol 145  0 - 200 mg/dL   Triglycerides 865 (*) <150 mg/dL   HDL 46  >78 mg/dL   Total CHOL/HDL Ratio 3.2     VLDL 67 (*) 0 - 40 mg/dL   LDL Cholesterol 32  0 - 99 mg/dL   Comment:             Total Cholesterol/HDL:CHD Risk     Coronary Heart Disease Risk Table                         Men   Women      1/2 Average Risk   3.4   3.3  Average Risk       5.0   4.4      2 X Average Risk   9.6   7.1      3 X Average Risk  23.4   11.0                Use the calculated Patient Ratio     above and the CHD Risk Table     to determine the patient's CHD Risk.                ATP III CLASSIFICATION (LDL):      <100     mg/dL   Optimal      [id number]  mg/dL   Near or Above                        Optimal      130-159  mg/dL   Borderline      [id number]  mg/dL   High      >147>190     mg/dL   Very High     [facility]    Physical Findings: AIMS: Facial and Oral Movements Muscles of Facial Expression: None, normal Lips and Perioral Area: None, normal Jaw: None, normal Tongue: None, normal,Extremity Movements Upper (arms, wrists, hands, fingers): None, normal Lower (legs, knees, ankles, toes): None, normal, Trunk Movements Neck, shoulders, hips: None, normal, Overall Severity Severity of abnormal movements (highest score from questions above): None, normal Incapacitation due to abnormal movements: None, normal Patient's awareness of abnormal movements (rate only patient's report): No Awareness, Dental Status Current problems with teeth and/or dentures?: No Does patient usually wear dentures?: No  CIWA:    COWS:     Treatment Plan Summary: Daily contact with patient to assess and evaluate symptoms and progress in treatment Medication management  Plan: 1. Continue crisis management and stabilization.  2. Medication management: Continue Cymbalta 30 mg for depression, Librium 25 mg TID prn alcohol withdrawal.  3. Encouraged patient to attend groups and participate in group counseling sessions and activities.  4. Discharge plan in progress.  5. Continue current treatment plan.  6. Address health issues: Vitals reviewed and stable. Continue Zocor 20 mg daily for  treatment of elevated cholesterol/ triglycerides. Draw free t4/TSH to further elevated TSH level. Daily CBG check for Diabetes with continuation of Metformin, Amaryl as ordered.   Medical Decision Making Problem Points:  Established problem, stable/improving (1), Review of last therapy session (1) and Review of psycho-social stressors (1) Data Points:  Review or order clinical lab tests (1) Review of medication regiment & side effects (2) Review of new medications or change in dosage (2)  I certify that inpatient services furnished can reasonably be expected to improve the patient's condition.   Fransisca KaufmannLaura Davis NP-C 06/18/2013, 4:54 PM  I agreed with the findings, treatment and disposition plan of this patient. Kathryne SharperSyed London Tarnowski, MD

## 2013-06-18 NOTE — Progress Notes (Signed)
.  Psychoeducational Group Note    Date: 06/18/2013 Time:  0930  Goal Setting Purpose of Group: To be able to set a goal that is measurable and that can be accomplished in one day Participation Level:  Active  Participation Quality:  Appropriate  Affect:  Appropriate  Cognitive:  Oriented  Insight:  Improving  Engagement in Group:  Engaged  Additional Comments:  Pt was attentive and partisipated  [name]  

## 2013-06-18 NOTE — Progress Notes (Signed)
Patient ID: Becky Villarreal, female   DOB: 03/27/1955, 58 y.o.   MRN: [id number] Pt reports she has post op appointment on Tuesday and thus will need to be discharged Monday. Pt requested weekend LCSW contact significant other, Curtiss Baker at [phone], to confirm. Significant other confirms appointment is for Tuesday.  Carney Bernatherine C Katharine Rochefort, LCSW

## 2013-06-18 NOTE — Progress Notes (Signed)
D) Pt has been attending the groups and interacting with her peers. Affect and mood are appropriate. Pt rates her depression and hopelessness both at a 7 and denies SI and HI. States she is feeling better about herself and is starting to work on other ideas as to what she can do. Feels hurt about what happened but overall is starting to feel better. Is concentrating on her sobriety and how to best help herself when she leaves here. Pt states she feels motivated to work hard on herself and get her life back in order. A) Given support, reassurance and praise, along with encouragement. Provided Pt with a 1:1 where she talked about her situation at work. Provided with a non judgmental approach and active listening R) Denies SI and HI. States she feels bad but is learning that this was not a good fit for her. Working on her soberity

## 2013-06-18 NOTE — Progress Notes (Signed)
The focus of this group is to help patients review their daily goal of treatment and discuss progress on daily workbooks. Pt attended the evening group session and responded to all discussion prompts from the Writer. Pt shared that today was a good day on the unit, the highlight of which was feeling "stronger and less depressed." Pt reported having no additional requests from Nursing Staff this evening. Pt's affect was bright during group and the Writer observed her smiling and laughing. Pt also volunteered several encouraging comments to her peers.

## 2013-06-18 NOTE — Progress Notes (Signed)
Writer has observed patient up in the dayroom interacting appropriately with peers. She attended group this evening and is compliant with medications. She c/o constipation and received senokot. She currently denies si/hi/a/v hallucinations. She reports that her depression is not as bad as when she was first admitted. Support and encouragement given, safety maintained on unit with 15 min checks.

## 2013-06-18 NOTE — Progress Notes (Signed)
Psychoeducational Group Note  Date: 06/18/2013 Time:  1015  Group Topic/Focus:  Identifying Needs:   The focus of this group is to help patients identify their personal needs that have been historically problematic and identify healthy behaviors to address their needs.  Participation Level:  Active  Participation Quality:  Appropriate  Affect:  Appropriate  Cognitive:  Oriented  Insight:  improving  Engagement in Group:  Engaged  Additional Comments:  Pt participated in the group Gloriajean Okun A  

## 2013-06-18 NOTE — [facility] Group Notes (Signed)
[facility] LCSW Group Therapy Note  06/18/2013 1:15 PM  Type of Therapy and Topic:  Group Therapy: Avoiding Self-Sabotaging and Enabling Behaviors  Participation Level:  Active; engaged  Mood: Pleasant  Description of Group:     Learn how to identify obstacles, self-sabotaging and enabling behaviors, what are they, why do we do them and what needs do these behaviors meet? Discuss unhealthy relationships and how to have positive healthy boundaries with those that sabotage and enable. Explore aspects of self-sabotage and enabling in yourself and how to limit these self-destructive behaviors in everyday life.  Therapeutic Goals: 1. Patient will identify one obstacle that relates to self-sabotage and enabling behaviors 2. Patient will identify one personal self-sabotaging or enabling behavior they did prior to admission 3. Patient able to establish a plan to change the above identified behavior they did prior to admission:  4. Patient will demonstrate ability to communicate their needs through discussion and/or role plays.   Summary of Patient Progress: The main focus of today's process group was to explain what "self-sabotage" means and use Motivational Interviewing to discuss what benefits, negative or positive, were involved in a self-identified self-sabotaging behavior. We then talked about reasons the patient may want to change the behavior and his/her current desire to change. A scaling question was used to help patient look at where they are now in motivation for change, from 1 to 10 (lowest to highest motivation).  Becky Villarreal shared that her motivation is high currently; she sees herself being successful staying away from the alcohol yet admits that she allows procrastination to sometimes get in her way. She expressed gratitude for her supports and offered support to another group member. When asked what she is looking forward to within the next 3 months Becky Villarreal shared she is looking forward to being sober  for the next 90 days.    Therapeutic Modalities:   Cognitive Behavioral Therapy Person-Centered Therapy Motivational Interviewing   Becky Bernatherine C Thamar Holik, LCSW

## 2013-06-19 DIAGNOSIS — F332 Major depressive disorder, recurrent severe without psychotic features: Principal | ICD-10-CM

## 2013-06-19 DIAGNOSIS — R45851 Suicidal ideations: Secondary | ICD-10-CM

## 2013-06-19 DIAGNOSIS — F1910 Other psychoactive substance abuse, uncomplicated: Secondary | ICD-10-CM

## 2013-06-19 LAB — TSH: TSH: 6.13 u[IU]/mL — ABNORMAL HIGH (ref 0.350–4.500)

## 2013-06-19 LAB — GLUCOSE, CAPILLARY: Glucose-Capillary: 200 mg/dL — ABNORMAL HIGH (ref 70–99)

## 2013-06-19 LAB — T4, FREE: Free T4: 1.02 ng/dL (ref 0.80–1.80)

## 2013-06-19 MED ORDER — MAGNESIUM CITRATE PO SOLN
1.0000 | Freq: Once | ORAL | Status: AC
  Administered 2013-06-19: 1 via ORAL

## 2013-06-19 NOTE — Progress Notes (Signed)
Patient has been up and active on the unit, attended group this evening and has voiced no complaints.She reports that she has had a good day and her boyfriend visited her and he misses her being home. She reports that she is not sure how her co-workers will treat her once she returns to work or if she wants to continue working there.  Patient currently denies having pain, -si/hi/a/v hall. Support and encouragement offered, safety maintained on unit, will continue to monitor.

## 2013-06-19 NOTE — [facility] Group Notes (Signed)
[facility] LCSW Group Therapy Note   06/19/2013  1:15 to 2:05 PM  Type of Therapy and Topic: Group Therapy: Feelings Around Returning Home & Establishing a Supportive Framework and Activity to Identify signs of Improvement or Decompensation   Participation Level: Actively Engaged  Mood: Appropriate  Description of Group:  Patients first processed thoughts and feelings about up coming discharge. These included fears of upcoming changes, lack of change, new living environments, judgements and expectations from others and overall stigma of MH issues. We then discussed what is a supportive framework? What does it look like feel like and how do I discern it from and unhealthy non-supportive network? Learn how to cope when supports are not helpful and don't support you. Discuss what to do when your family/friends are not supportive.   Therapeutic Goals Addressed in Processing Group:  1. Patient will identify one healthy supportive network that they can use at discharge. 2. Patient will identify one factor of a supportive framework and how to tell it from an unhealthy network. 3. Patient able to identify one coping skill to use when they do not have positive supports from others. 4. Patient will demonstrate ability to communicate their needs through discussion and/or role plays.  Summary of Patient Progress:  Pt engaged easily during group session as other patients processed their anxiety about discharge and described healthy supports. She shared her main support is her boyfriend and she realizes need to add more supports and considers her willingness to go to IOP program a step in right direction. Patient offered support and encouragement to others and shared that she feels good about coming here and feels she received the help requested.   Carney Bernatherine C Gertude Benito, LCSW

## 2013-06-19 NOTE — Progress Notes (Signed)
D) Pt rates her depression and hopelessness both at an 8. Denies SI and HI. Pt states she plans to return back to her old job and then look for another one. Pt states, "I am feeling good now, but I am not so foolish to think that this good feeling will last. I know that I am going to feel like drinking. I have things in place so that I won't drink when I get stressed". Affect and interactions are appropriate.  A) Given support, reassurance and praise, along with encouragement. Provided with a 1:1. R) Denies SI and HI. States overall she is feeling better and is looking forward to intense out patient therapy downstairs.

## 2013-06-19 NOTE — Progress Notes (Signed)
Psychoeducational Group Note  Date:  06/19/2013 Time:  1015  Group Topic/Focus:  Making Healthy Choices:   The focus of this group is to help patients identify negative/unhealthy choices they were using prior to admission and identify positive/healthier coping strategies to replace them upon discharge.  Participation Level:  Active  Participation Quality:  Appropriate  Affect:  Appropriate  Cognitive:  Oriented  Insight:  Improving  Engagement in Group:  Engaged  Additional Comments:  Pt engaged in the group   [name] 06/19/2013 

## 2013-06-19 NOTE — Progress Notes (Signed)
Psychoeducational Group Note  Date: 06/19/2013 Time  0930  Group Topic/Focus:  Gratefulness:  The focus of this group is to help patients identify what two things they are most grateful for in their lives. What helps ground them and to center them on their work to their recovery.  Participation Level:  Active  Participation Quality:  Appropriate  Affect:  Appropriate  Cognitive:  Oriented  Insight:  Improving  Engagement in Group:  Improving  Additional Comments:  Pt participated in the group.  Azhar Knope A   

## 2013-06-19 NOTE — Progress Notes (Signed)
[facility]  06/19/2013 10:20 AM Becky Villarreal  MRN:  [id number] Subjective:   Patient states "Last night was wonderful becuase I had earplugs.You guys should offer earplugs when people are admitted. The Trazadone worked much better last night, its only my second time taking it. My memory is getting better, and I am getting stronger. I was drinking so much before I came, 1.5 liter of [place] a day. States prior to coming here I was very forgetful.  I want to attend IOP after I leave here for a few weeks." She had a Gastric bypass, and has problems with constipation. She is requesting Mag citrate, she has tried senokot and warm prune juice with no relief.   Objective:  Patient is visible on the unit and attending the scheduled groups. She reports decreasing symptoms of depression. Rates her depression a 3/10, and anxiety 3/10, although she is fearful about going home because she doesn't know how she will do.The patient is now showing insight into her mental health problems. 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 Denies withdrawal symptoms at this time. 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 She reports knowing now that she is not able to handle alcohol and plans to stay away from it. Patient is complaint with medications and denies any adverse effects.  Diagnosis:   DSM5: Total Time spent with patient: 30 minutes Schizophrenia Disorders:  Obsessive-Compulsive Disorders:  Trauma-Stressor Disorders:  Substance/Addictive Disorders:  Depressive Disorders:  AXIS I: Major Depression, Recurrent severe and Substance Abuse  AXIS II: Deferred  AXIS III:  Past Medical History   Diagnosis  Date   .  Depression    .  Diabetes mellitus without complication    .  Hypertension    .  Stroke    .  Gastric ulcer     AXIS IV: economic problems, occupational problems,  other psychosocial or environmental problems, problems related to social environment and problems with primary support group  AXIS V: 41-50 serious symptoms   ADL's:  Intact  Sleep: Fair  Appetite:  Good  Suicidal Ideation:  Passive SI no plan Homicidal Ideation:  Denies AEB (as evidenced by):  Psychiatric Specialty Exam: Physical Exam   Review of Systems  Constitutional: Negative.   HENT: Negative.   Eyes: Negative.   Respiratory: Negative.   Cardiovascular: Negative.   Gastrointestinal: Negative.   Genitourinary: Negative.   Musculoskeletal: Negative.   Skin: Negative.   Neurological: Negative.   Endo/Heme/Allergies: Negative.   Psychiatric/Behavioral: Positive for depression, suicidal ideas and substance abuse. The patient is nervous/anxious and has insomnia.     Blood pressure 144/81, pulse 86, temperature 97.2 F (36.2 C), temperature source Oral, resp. rate 18, height 5\' 5"  (1.651 m), weight 87.544 kg (193 lb).Body mass index is 32.12 kg/(m^2).  General Appearance: Casual  Eye Contact::  Good  Speech:  Clear and Coherent  Volume:  Normal  Mood:  Anxious and Dysphoric  Affect:  Congruent  Thought Process:  Goal Directed and Intact  Orientation:  Full (Time, Place, and Person)  Thought Content:  Rumination  Suicidal Thoughts:  Yes.  without intent/plan  Homicidal Thoughts:  No  Memory:  Immediate;   Good Recent;   Good Remote;   Good  Judgement:  Impaired  Insight:  Shallow  Psychomotor Activity:  Normal  Concentration:  Fair  Recall:  Fair  Fund of Knowledge:Good  Language: Good  Akathisia:  No  Handed:  Right  AIMS (if indicated):     Assets:  Communication Skills Desire for Improvement Financial Resources/Insurance Leisure Time Physical Health Resilience Social Support Talents/Skills Vocational/Educational  Sleep:  Number of Hours: 6.5   Musculoskeletal: Strength & Muscle Tone: within normal limits Gait & Station: normal Patient leans:  N/A  Current Medications: Current Facility-Administered Medications  Medication Dose Route Frequency Provider Last Rate Last Dose  . acetaminophen (TYLENOL) tablet 650 mg  650 mg Oral Q6H PRN Kristeen Mans, NP      . alum & mag hydroxide-simeth (MAALOX/MYLANTA) 200-200-20 MG/5ML suspension 30 mL  30 mL Oral Q4H PRN Kristeen Mans, NP      . amLODipine (NORVASC) tablet 5 mg  5 mg Oral Daily Kristeen Mans, NP   5 mg at 06/19/13 0825  . aspirin chewable tablet 81 mg  81 mg Oral Daily Kristeen Mans, NP   81 mg at 06/19/13 0825  . carvedilol (COREG) tablet 3.125 mg  3.125 mg Oral BID WC Kristeen Mans, NP   3.125 mg at 06/19/13 0825  . chlordiazePOXIDE (LIBRIUM) capsule 25 mg  25 mg Oral TID PRN Nehemiah Settle, MD      . DULoxetine (CYMBALTA) DR capsule 30 mg  30 mg Oral Daily Nehemiah Settle, MD   30 mg at 06/19/13 0825  . glimepiride (AMARYL) tablet 4 mg  4 mg Oral Daily Kristeen Mans, NP   4 mg at 06/19/13 0825  . magnesium hydroxide (MILK OF MAGNESIA) suspension 30 mL  30 mL Oral Daily PRN Kristeen Mans, NP      . metFORMIN (GLUCOPHAGE) tablet 1,000 mg  1,000 mg Oral Q breakfast Kristeen Mans, NP   1,000 mg at 06/19/13 0825  . multivitamin with minerals tablet 1 tablet  1 tablet Oral Daily Kristeen Mans, NP   1 tablet at 06/19/13 0825  . senna-docusate (Senokot-S) tablet 1 tablet  1 tablet Oral QHS PRN Fransisca Kaufmann, NP   1 tablet at 06/18/13 2121  . simvastatin (ZOCOR) tablet 20 mg  20 mg Oral q1800 Kristeen Mans, NP   20 mg at 06/18/13 1703  . sucralfate (CARAFATE) 1 GM/10ML suspension 1 g  1 g Oral QID Kristeen Mans, NP   1 g at 06/19/13 0830  . traZODone (DESYREL) tablet 50 mg  50 mg Oral QHS Nehemiah Settle, MD   50 mg at 06/18/13 2108    Lab Results:  Results for orders placed during the hospital encounter of 06/16/13 (from the past 48 hour(s))  GLUCOSE, CAPILLARY     Status: Abnormal   Collection Time    06/17/13  5:03 PM      Result Value Ref Range    Glucose-Capillary 203 (*) 70 - 99 mg/dL  TSH     Status: Abnormal   Collection Time    06/18/13  6:35 AM      Result Value Ref Range   TSH 5.410 (*) 0.350 - 4.500 uIU/mL   Comment: Please note change in reference range.     Performed at [facility]  T3, FREE     Status: None   Collection Time    06/18/13  6:35 AM      Result Value Ref Range   T3, Free 2.8  2.3 - 4.2 pg/mL   Comment: Performed at [facility]  LIPID PANEL  Status: Abnormal   Collection Time    06/18/13  6:35 AM      Result Value Ref Range   Cholesterol 145  0 - 200 mg/dL   Triglycerides 409333 (*) <150 mg/dL   HDL 46  >81>39 mg/dL   Total CHOL/HDL Ratio 3.2     VLDL 67 (*) 0 - 40 mg/dL   LDL Cholesterol 32  0 - 99 mg/dL   Comment:            Total Cholesterol/HDL:CHD Risk     Coronary Heart Disease Risk Table                         Men   Women      1/2 Average Risk   3.4   3.3      Average Risk       5.0   4.4      2 X Average Risk   9.6   7.1      3 X Average Risk  23.4   11.0                Use the calculated Patient Ratio     above and the CHD Risk Table     to determine the patient's CHD Risk.                ATP III CLASSIFICATION (LDL):      <100     mg/dL   Optimal      [id number]  mg/dL   Near or Above                        Optimal      130-159  mg/dL   Borderline      [id number]  mg/dL   High      >308>190     mg/dL   Very High     [facility]  GLUCOSE, CAPILLARY     Status: Abnormal   Collection Time    06/19/13  6:17 AM      Result Value Ref Range   Glucose-Capillary 200 (*) 70 - 99 mg/dL   Comment 1 Notify RN      Physical Findings: AIMS: Facial and Oral Movements Muscles of Facial Expression: None, normal Lips and Perioral Area: None, normal Jaw: None, normal Tongue: None, normal,Extremity Movements Upper (arms, wrists, hands, fingers): None, normal Lower (legs, knees, ankles, toes): None, normal, Trunk Movements Neck, shoulders, hips: None, normal,  Overall Severity Severity of abnormal movements (highest score from questions above): None, normal Incapacitation due to abnormal movements: None, normal Patient's awareness of abnormal movements (rate only patient's report): No Awareness, Dental Status Current problems with teeth and/or dentures?: No Does patient usually wear dentures?: No  CIWA:    COWS:     Treatment Plan Summary: Daily contact with patient to assess and evaluate symptoms and progress in treatment Medication management  Plan: 1. Continue crisis management and stabilization.  2. Medication management: Continue Cymbalta 30 mg for depression, Librium 25 mg TID prn alcohol withdrawal.  3. Encouraged patient to attend groups and participate in group counseling sessions and activities.  4. Discharge plan in progress.  5. Continue current treatment plan.  6. Address health issues: Vitals reviewed and stable. Continue Zocor 20 mg daily for treatment of elevated cholesterol/ triglycerides. Draw free t4/TSH to further elevated TSH level. Daily CBG check for Diabetes with continuation  of Metformin, Amaryl as ordered. Reviewed medical concerns will re-assess elevated TSH, LFT, and a1c outpatient basis in 6-8 weeks.   Medical Decision Making Problem Points:  Established problem, stable/improving (1), Review of last therapy session (1) and Review of psycho-social stressors (1) Data Points:  Review or order clinical lab tests (1) Review of medication regiment & side effects (2) Review of new medications or change in dosage (2)  I certify that inpatient services furnished can reasonably be expected to improve the patient's condition.   Juel Burrow Starkes FNP-BC  06/19/2013, 10:20 AM

## 2013-06-20 LAB — GLUCOSE, CAPILLARY: Glucose-Capillary: 213 mg/dL — ABNORMAL HIGH (ref 70–99)

## 2013-06-20 MED ORDER — TRAZODONE HCL 50 MG PO TABS
25.0000 mg | ORAL_TABLET | Freq: Every day | ORAL | Status: DC
  Administered 2013-06-20: 25 mg via ORAL
  Filled 2013-06-20 (×2): qty 1
  Filled 2013-06-20: qty 2

## 2013-06-20 NOTE — Tx Team (Signed)
Interdisciplinary Treatment Plan Update (Adult)  Date: 06/20/2013  Time Reviewed:  9:45 AM  Progress in Treatment: Attending groups: Yes Participating in groups:  Yes Taking medication as prescribed:  Yes Tolerating medication:  Yes Family/Significant othe contact made: Yes, with pt's boyfriend Patient understands diagnosis:  Yes Discussing patient identified problems/goals with staff:  Yes Medical problems stabilized or resolved:  Yes Denies suicidal/homicidal ideation: Yes Issues/concerns per patient self-inventory:  Yes Other:  New problem(s) identified: N/A  Discharge Plan or Barriers: CSW will assess for appropriate referrals.   Reason for Continuation of Hospitalization: Anxiety Depression Medication Stabilization  Comments: N/A  Estimated length of stay: 1 day, d/c tomorrow  For review of initial/current patient goals, please see plan of care.  Attendees: Patient:     Family:     Physician:     Nursing:   Marzetta Boardhrista Dopson, RN 06/20/2013 10:24 AM   Clinical Social Worker:  Reyes Ivanhelsea Horton, LCSW 06/20/2013 10:24 AM   Other: Verne SpurrNeil Mashburn, PA 06/20/2013 10:24 AM   Other:  Quintella ReichertBeverly Knight, RN 06/20/2013 10:24 AM   Other:  Neill Loftarol Davis, RN 06/20/2013 10:24 AM   Other:  Onnie BoerJennifer Clark, UR case manager 06/20/2013 10:25 AM   Other:    Other:    Other:    Other:    Other:    Other:     Scribe for Treatment Team:   Carmina MillerHorton, Ethyle Tiedt Nicole, 06/20/2013 10:24 AM

## 2013-06-20 NOTE — [facility] Group Notes (Signed)
Adult Psychoeducational Group Note  Date:  06/20/2013 Time:  9:45 PM  Group Topic/Focus:  Wrap-Up Group:   The focus of this group is to help patients review their daily goal of treatment and discuss progress on daily workbooks.  Participation Level:  Active  Participation Quality:  Appropriate, Attentive, Sharing and Supportive  Affect:  Appropriate  Cognitive:  Alert, Appropriate and Oriented  Insight: Appropriate and Good  Engagement in Group:  Engaged and Supportive  Modes of Intervention:  Problem-solving and Support  Additional Comments:  Lauris Poagmelia shared with the group her day was good because she stayed to herself to do some self reflection.  She apologized to the group because she stated something earlier that bothered some.  She is great she spent time for herself.  Kuulei Kleier 06/20/2013, 9:45 PM

## 2013-06-20 NOTE — Progress Notes (Signed)
Patient ID: Becky Villarreal, female   DOB: August 05, 1955, 58 y.o.   MRN: [id number] [facility] MD Progress Note  06/20/2013 4:19 PM Becky Villarreal  MRN:  [id number] Subjective:  Met with patient to discuss her progress and she states she is doing better.  She reports some sedation with the Trazodone this morning and would like the dose reduced. She reports her depression is a 3/10 and her anxiety 3-5/10. She denies SI/HI or AVH. She does note that she hopes to d/c soon as she needs to return to work, but is also considering CDIOP. She is not interested in going to [place] as she felt that it "just wasn't for her."  She denies any symptoms of withdrawal today. [name] states she has had an episode of "self actualization and now feels that she did indeed have significant anxiety, which she had denied to herself in the past."   Objective:  Patient is visible on the unit and attending the scheduled groups. She reports decreasing symptoms of depression. Rates her depression a 3/10, and anxiety 3/10, although she is fearful about going home because she doesn't know how she will do.The patient is now showing insight into her mental health problems. 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 Denies withdrawal symptoms at this time. 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 She reports knowing now that she is not able to handle alcohol and plans to stay away from it. Patient is complaint with medications and denies any adverse effects.  Diagnosis:   DSM5: Total Time spent with patient: 30 minutes Schizophrenia Disorders:  Obsessive-Compulsive Disorders:  Trauma-Stressor Disorders:  Substance/Addictive Disorders:  Depressive Disorders:  AXIS I: Major Depression, Recurrent severe and Substance Abuse  AXIS II: Deferred  AXIS III:  Past Medical History   Diagnosis  Date   .  Depression    .  Diabetes  mellitus without complication    .  Hypertension    .  Stroke    .  Gastric ulcer     AXIS IV: economic problems, occupational problems, other psychosocial or environmental problems, problems related to social environment and problems with primary support group  AXIS V: 41-50 serious symptoms   ADL's:  Intact  Sleep: Fair  Appetite:  Good  Suicidal Ideation:  Passive SI no plan Homicidal Ideation:  Denies AEB (as evidenced by):  Psychiatric Specialty Exam: Physical Exam  Review of Systems  Constitutional: Negative.   HENT: Negative.   Eyes: Negative.   Respiratory: Negative.   Cardiovascular: Negative.   Gastrointestinal: Negative.   Genitourinary: Negative.   Musculoskeletal: Negative.   Skin: Negative.   Neurological: Negative.   Endo/Heme/Allergies: Negative.   Psychiatric/Behavioral: Positive for depression, suicidal ideas and substance abuse. The patient is nervous/anxious and has insomnia.     Blood pressure 144/76, pulse 78, temperature 97.7 F (36.5 C), temperature source Oral, resp. rate 18, height _0  (1.651 m), weight 87.544 kg (193 lb).Body mass index is 32.12 kg/(m^2).  General Appearance: Casual  Eye Contact::  Good  Speech:  Clear and Coherent  Volume:  Normal  Mood:  Anxious and Dysphoric  Affect:  Congruent  Thought Process:  Goal Directed and Intact  Orientation:  Full (Time, Place, and Person)  Thought Content:  Rumination  Suicidal Thoughts:  Yes.  without intent/plan  Homicidal Thoughts:  No  Memory:  Immediate;   Good Recent;  Good Remote;   Good  Judgement:  Impaired  Insight:  Shallow  Psychomotor Activity:  Normal  Concentration:  Fair  Recall:  [place] of Knowledge:Good  Language: Good  Akathisia:  No  Handed:  Right  AIMS (if indicated):     Assets:  Communication Skills Desire for Improvement Financial Resources/Insurance Leisure Time Physical Health Resilience Social Support Talents/Skills Vocational/Educational   Sleep:  Number of Hours: 6.5   Musculoskeletal: Strength & Muscle Tone: within normal limits Gait & Station: normal Patient leans: N/A  Current Medications: Current Facility-Administered Medications  Medication Dose Route Frequency Provider Last Rate Last Dose  . acetaminophen (TYLENOL) tablet 650 mg  650 mg Oral Q6H PRN Lurena Nida, NP   650 mg at 06/20/13 0756  . alum & mag hydroxide-simeth (MAALOX/MYLANTA) 200-200-20 MG/5ML suspension 30 mL  30 mL Oral Q4H PRN Lurena Nida, NP      . amLODipine (NORVASC) tablet 5 mg  5 mg Oral Daily Lurena Nida, NP   5 mg at 06/20/13 0755  . aspirin chewable tablet 81 mg  81 mg Oral Daily Lurena Nida, NP   81 mg at 06/20/13 0755  . carvedilol (COREG) tablet 3.125 mg  3.125 mg Oral BID WC Lurena Nida, NP   3.125 mg at 06/20/13 0755  . chlordiazePOXIDE (LIBRIUM) capsule 25 mg  25 mg Oral TID PRN Durward Parcel, MD      . DULoxetine (CYMBALTA) DR capsule 30 mg  30 mg Oral Daily Durward Parcel, MD   30 mg at 06/20/13 0755  . glimepiride (AMARYL) tablet 4 mg  4 mg Oral Daily Lurena Nida, NP   4 mg at 06/20/13 0755  . magnesium hydroxide (MILK OF MAGNESIA) suspension 30 mL  30 mL Oral Daily PRN Lurena Nida, NP      . metFORMIN (GLUCOPHAGE) tablet 1,000 mg  1,000 mg Oral Q breakfast Lurena Nida, NP   1,000 mg at 06/20/13 0755  . multivitamin with minerals tablet 1 tablet  1 tablet Oral Daily Lurena Nida, NP   1 tablet at 06/20/13 0755  . senna-docusate (Senokot-S) tablet 1 tablet  1 tablet Oral QHS PRN Elmarie Shiley, NP   1 tablet at 06/18/13 2121  . simvastatin (ZOCOR) tablet 20 mg  20 mg Oral q1800 Lurena Nida, NP   20 mg at 06/19/13 1658  . sucralfate (CARAFATE) 1 GM/10ML suspension 1 g  1 g Oral QID Lurena Nida, NP   1 g at 06/20/13 1133  . traZODone (DESYREL) tablet 50 mg  50 mg Oral QHS Durward Parcel, MD   50 mg at 06/19/13 2124    Lab Results:  Results for orders placed during the hospital  encounter of 06/16/13 (from the past 48 hour(s))  GLUCOSE, CAPILLARY     Status: Abnormal   Collection Time    06/19/13  6:17 AM      Result Value Ref Range   Glucose-Capillary 200 (*) 70 - 99 mg/dL   Comment 1 Notify RN    TSH     Status: Abnormal   Collection Time    06/19/13  6:38 AM      Result Value Ref Range   TSH 6.130 (*) 0.350 - 4.500 uIU/mL   Comment: Please note change in reference range.     Performed at [facility]  T4, FREE     Status: None   Collection Time  06/19/13  6:38 AM      Result Value Ref Range   Free T4 1.02  0.80 - 1.80 ng/dL   Comment: Performed at [place], CAPILLARY     Status: Abnormal   Collection Time    06/20/13  6:50 AM      Result Value Ref Range   Glucose-Capillary 213 (*) 70 - 99 mg/dL    Physical Findings: AIMS: Facial and Oral Movements Muscles of Facial Expression: None, normal Lips and Perioral Area: None, normal Jaw: None, normal Tongue: None, normal,Extremity Movements Upper (arms, wrists, hands, fingers): None, normal Lower (legs, knees, ankles, toes): None, normal, Trunk Movements Neck, shoulders, hips: None, normal, Overall Severity Severity of abnormal movements (highest score from questions above): None, normal Incapacitation due to abnormal movements: None, normal Patient's awareness of abnormal movements (rate only patient's report): No Awareness, Dental Status Current problems with teeth and/or dentures?: No Does patient usually wear dentures?: No  CIWA:    COWS:     Treatment Plan Summary: Daily contact with patient to assess and evaluate symptoms and progress in treatment Medication management  Plan: 1. Continue crisis management and stabilization.  2. Medication management: Continue Cymbalta 30 mg for depression, Librium 25 mg TID prn alcohol withdrawal.  3. Encouraged patient to attend groups and participate in group counseling sessions and activities.  4. Discharge plan in  progress.  5. Continue current treatment plan.  6. Address health issues: Vitals reviewed and stable. Continue Zocor 20 mg daily for treatment of elevated cholesterol/ triglycerides. Draw free t4/TSH to further elevated TSH level. Daily CBG check for Diabetes with continuation of Metformin, Amaryl as ordered. Reviewed medical concerns will re-assess elevated TSH, LFT, and a1c outpatient basis in 6-8 weeks.  7. Will decrease Trazodone to 90m at hs. 8. Disposition is in progress. Medical Decision Making Problem Points:  Established problem, stable/improving (1), Review of last therapy session (1) and Review of psycho-social stressors (1) Data Points:  Review or order clinical lab tests (1) Review of medication regiment & side effects (2) Review of new medications or change in dosage (2)  I certify that inpatient services furnished can reasonably be expected to improve the patient's condition.  NMarlane Hatcher Mashburn RPAC 4:36 PM 06/20/2013 Agree with assessment and plan IGeralyn FlashA. LMat[place]

## 2013-06-20 NOTE — Progress Notes (Signed)
Adult Psychoeducational Group Note  Date:  06/20/2013 Time:  10:00am Group Topic/Focus:  Wellness Toolbox:   The focus of this group is to discuss various aspects of wellness, balancing those aspects and exploring ways to increase the ability to experience wellness.  Patients will create a wellness toolbox for use upon discharge.  Participation Level:  Active  Participation Quality:  Appropriate and Attentive  Affect:  Appropriate  Cognitive:  Alert and Appropriate  Insight: Appropriate  Engagement in Group:  Engaged  Modes of Intervention:  Discussion and Education  Additional Comments:  Pt attended and participated in group. Discussion was on Wellness. The question was asked what does wellness mean to you? Pt stated wellness means to have balance with spiritual and emotional wellness.  Pryor Curiaanya D Garner 06/20/2013, 6:22 PM

## 2013-06-20 NOTE — [facility] Group Notes (Signed)
[facility]   06/20/2013 8:45 AM  Participation Quality:  Alert, Appropriate and Oriented  Mood/Affect:  Calm  Depression Rating: 3    Anxiety Rating:  3  Thoughts of Suicide:  Pt denies SI/HI  Will you contract for safety?   Yes  Current AVH:  Pt denies  Plan for Discharge/Comments:  Pt attended discharge planning group and actively participated in group.  CSW provided pt with today's workbook.  Pt reports feeling sleepy today.  [place] and has boyfriend as support.  Referral was made for MH IOP; case manager should come to assess pt today.  CSW will make appropriate referrals.  No further needs voiced by pt at this time.    Transportation Means: Pt reports access to transportation - boyfriend will pick pt up   Supports: No supports mentioned at this time  Becky IvanChelsea Horton, LCSW 06/20/2013 9:33 AM

## 2013-06-20 NOTE — [facility] Group Notes (Signed)
[facility] LCSW Group Therapy  06/20/2013   1:15 PM   Type of Therapy:  Group Therapy  Participation Level:  Active  Participation Quality:  Attentive, Sharing and Supportive  Affect:  Calm  Cognitive:  Alert and Oriented  Insight:  Developing/Improving and Engaged  Engagement in Therapy:  Developing/Improving and Engaged  Modes of Intervention:  Clarification, Confrontation, Discussion, Education, Exploration, Limit-setting, Orientation, Problem-solving, Rapport Building, Dance movement psychotherapisteality Testing, Socialization and Support  Summary of Progress/Problems: Pt identified obstacles faced currently and processed barriers involved in overcoming these obstacles. Pt identified steps necessary for overcoming these obstacles and explored motivation (internal and external) for facing these difficulties head on. Pt further identified one area of concern in their lives and chose a goal to focus on for today.  Pt was able to process the issue at work, sharing that she has anxiety and fear about returning to work and dealing with that.  Pt states that she's learned she can't control what other people think or say about her.  Pt actively participated and was engaged in group discussion.    Becky IvanChelsea Horton, LCSW 06/20/2013  2:28 PM

## 2013-06-20 NOTE — Progress Notes (Signed)
Patient ID: Becky Villarreal, female   DOB: 07/24/1955, 58 y.o.   MRN: [id number]  D: Pt. Denies SI/HI and A/V Hallucinations. Patient rates her depression 4/10 and her hopelessness at 3/10 for the day. Patient did report pain in her lower back this morning and received PRN Tylenol. Upon reassessment patient reported relief. Patient reports that she slept well last night but she is "groggy" this morning. Patient reports that she had a dream last night that she bought wine and was frightened about doing it.  A: Support and encouragement provided to the patient to talk to writer about any questions or concerns. Scheduled medications administered to patient per physician's orders.  R: Patient is receptive and cooperative. Patient is seen in the milieu and is attending groups. Q15 minute checks are maintained for safety.

## 2013-06-21 LAB — GLUCOSE, CAPILLARY: Glucose-Capillary: 196 mg/dL — ABNORMAL HIGH (ref 70–99)

## 2013-06-21 MED ORDER — ASPIRIN 81 MG PO CHEW: 81.0000 mg | CHEWABLE_TABLET | Freq: Every day | ORAL | Status: AC

## 2013-06-21 MED ORDER — TRAZODONE 25 MG HALF TABLET: 25.0000 mg | ORAL_TABLET | Freq: Every day | ORAL | Status: DC

## 2013-06-21 MED ORDER — CALCIUM CARBONATE-VITAMIN D 600-400 MG-UNIT PO TABS: 1.0000 | ORAL_TABLET | Freq: Every day | ORAL | Status: AC

## 2013-06-21 MED ORDER — DULOXETINE HCL 30 MG PO CPEP: 30.0000 mg | ORAL_CAPSULE | Freq: Every day | ORAL | Status: DC

## 2013-06-21 MED ORDER — GLIMEPIRIDE 4 MG PO TABS: 4.0000 mg | ORAL_TABLET | Freq: Every day | ORAL | Status: AC

## 2013-06-21 MED ORDER — SUCRALFATE 1 GM/10ML PO SUSP: 1.0000 g | Freq: Four times a day (QID) | ORAL | Status: DC

## 2013-06-21 MED ORDER — AMLODIPINE BESYLATE 5 MG PO TABS: 5.0000 mg | ORAL_TABLET | Freq: Every day | ORAL | Status: AC

## 2013-06-21 MED ORDER — SIMVASTATIN 20 MG PO TABS: 20.0000 mg | ORAL_TABLET | Freq: Every day | ORAL | Status: AC

## 2013-06-21 MED ORDER — METFORMIN HCL 1000 MG PO TABS: 1000.0000 mg | ORAL_TABLET | Freq: Every day | ORAL | Status: AC

## 2013-06-21 MED ORDER — CARVEDILOL 3.125 MG PO TABS: 3.1250 mg | ORAL_TABLET | Freq: Two times a day (BID) | ORAL | Status: AC

## 2013-06-21 NOTE — [facility] Suicide Risk Assessment (Signed)
   Demographic Factors:  Adolescent or young adult, Caucasian and Low socioeconomic status  Total Time spent with patient: 30 minutes  Psychiatric Specialty Exam: Physical Exam  ROS  Blood pressure 114/72, pulse 87, temperature 98.1 F (36.7 C), temperature source Oral, resp. rate 16, height 5\' 5"  (1.651 m), weight 87.544 kg (193 lb).Body mass index is 32.12 kg/(m^2).  General Appearance: Casual  Eye Contact::  Good  Speech:  Clear and Coherent  Volume:  Normal  Mood:  Euthymic  Affect:  Appropriate and Congruent  Thought Process:  Coherent and Goal Directed  Orientation:  Full (Time, Place, and Person)  Thought Content:  WDL  Suicidal Thoughts:  No  Homicidal Thoughts:  No  Memory:  NA Immediate;   Good Recent;   Good  Judgement:  Fair  Insight:  Good  Psychomotor Activity:  Normal  Concentration:  Good  Recall:  Good  Fund of Knowledge:Good  Language: Good  Akathisia:  NA  Handed:  Right  AIMS (if indicated):     Assets:  Communication Skills Desire for Improvement Financial Resources/Insurance Housing Leisure Time Physical Health Resilience Social Support Talents/Skills Transportation Vocational/Educational  Sleep:  Number of Hours: 6.25    Musculoskeletal: Strength & Muscle Tone: within normal limits Gait & Station: normal Patient leans: N/A   Mental Status Per Nursing Assessment::   On Admission:     Current Mental Status by Physician: NA  Loss Factors: NA  Historical Factors: Prior suicide attempts, Family history of mental illness or substance abuse and Impulsivity  Risk Reduction Factors:   Sense of responsibility to family, Religious beliefs about death, Employed, Living with another person, especially a relative, Positive social support, Positive therapeutic relationship and Positive coping skills or problem solving skills  Continued Clinical Symptoms:  Depression:   Comorbid alcohol abuse/dependence Recent sense of  peace/wellbeing Alcohol/Substance Abuse/Dependencies Previous Psychiatric Diagnoses and Treatments Medical Diagnoses and Treatments/Surgeries  Cognitive Features That Contribute To Risk:  Polarized thinking    Suicide Risk:  Minimal: No identifiable suicidal ideation.  Patients presenting with no risk factors but with morbid ruminations; may be classified as minimal risk based on the severity of the depressive symptoms  Discharge Diagnoses:   AXIS I:  Major Depression, Recurrent severe and Substance Abuse AXIS II:  Deferred AXIS III:   Past Medical History  Diagnosis Date  . Depression   . Diabetes mellitus without complication   . Hypertension   . Stroke   . Gastric ulcer    AXIS IV:  other psychosocial or environmental problems, problems related to social environment and problems with primary support group AXIS V:  61-70 mild symptoms  Plan Of Care/Follow-up recommendations:  Activity:  as tolerated Diet:  Regular  Is patient on multiple antipsychotic therapies at discharge:  No   Has Patient had three or more failed trials of antipsychotic monotherapy by history:  No  Recommended Plan for Multiple Antipsychotic Therapies: NA    Becky Villarreal 06/21/2013, 10:17 AM

## 2013-06-21 NOTE — Discharge Summary (Signed)
Physician Discharge Summary Note  Patient:  Becky Villarreal is an 58 y.o., female MRN:  [id number] DOB:  03/14/1955 Patient phone:  [phone] (home)  Patient address:   [address] [place] [address],  Total Time spent with patient: 30 minutes  Date of Admission:  06/16/2013 Date of Discharge:06/22/2013  Reason for Admission:  Worsening depression with ETOH abuse  Discharge Diagnoses: Active Problems:   MDD (major depressive disorder)   Alcohol abuse with intoxication   Psychiatric Specialty Exam:   Please See D/C SRA Physical Exam  ROS  Blood pressure 114/72, pulse 87, temperature 98.1 F (36.7 C), temperature source Oral, resp. rate 16, height 5\' 5"  (1.651 m), weight 87.544 kg (193 lb).Body mass index is 32.12 kg/(m^2).   Discharge Diagnoses:  AXIS I: Major Depression, Recurrent severe and Substance Abuse  AXIS II: Deferred  AXIS III:  Past Medical History   Diagnosis  Date   .  Depression    .  Diabetes mellitus without complication    .  Hypertension    .  Stroke    .  Gastric ulcer     AXIS IV: other psychosocial or environmental problems, problems related to social environment and problems with primary support group  AXIS V: 61-70 mild symptoms Level of Care:  OP  Hospital Course:  Becky Cavesmelia Potier is an 58 y.o. female likes to be called Becky Villarreal [facility]. Patient presents with severe symptoms of depression, anxiety and alcohol abuse vs dependence. She has increased levels of liver enzymes, demoted from job and is under a lot of stress at her job. 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          [facility].         Becky Villarreal was admitted to the adult unit where she was evaluated and her symptoms were identified. Medication management was discussed and implemented. She was  encouraged to participate in unit programming. Medical problems were identified and treated appropriately. Home medication was restarted as needed.                She was evaluated each day by a clinical provider to ascertain the patient's response to treatment.  Improvement was noted by the patient's report of decreasing symptoms, improved sleep and appetite, affect, medication tolerance, behavior, and participation in unit programming.  The patient was asked each day to complete a self inventory noting mood, mental status, pain, new symptoms, anxiety and concerns.         She responded well to medication and being in a therapeutic and supportive environment. Positive and appropriate behavior was noted and the patient was motivated for recovery.  She worked closely with the treatment team and case manager to develop a discharge plan with appropriate goals. Coping skills, problem solving as well as relaxation therapies were also part of the unit programming.         By the day of discharge she was in much improved condition than upon admission.  Symptoms were reported as significantly decreased or resolved completely. The patient denied SI/HI and voiced no AVH. She was motivated to continue taking medication with a goal of continued improvement in mental health.          Becky Villarreal was discharged home with a plan to follow up  as noted below. Consults:  None  Significant Diagnostic Studies:  None  Discharge Vitals:   Blood pressure 114/72, pulse 87, temperature 98.1 F (36.7 C), temperature source Oral, resp. rate 16, height 5\' 5"  (1.651 m), weight 87.544 kg (193 lb). Body mass index is 32.12 kg/(m^2). Lab Results:   Results for orders placed during the hospital encounter of 06/16/13 (from the past 72 hour(s))  GLUCOSE, CAPILLARY     Status: Abnormal   Collection Time    06/19/13  6:17 AM      Result Value Ref Range   Glucose-Capillary 200 (*) 70 - 99 mg/dL   Comment 1 Notify RN    TSH     Status:  Abnormal   Collection Time    06/19/13  6:38 AM      Result Value Ref Range   TSH 6.130 (*) 0.350 - 4.500 uIU/mL   Comment: Please note change in reference range.     Performed at [facility]  T4, FREE     Status: None   Collection Time    06/19/13  6:38 AM      Result Value Ref Range   Free T4 1.02  0.80 - 1.80 ng/dL   Comment: Performed at [facility]  GLUCOSE, CAPILLARY     Status: Abnormal   Collection Time    06/20/13  6:50 AM      Result Value Ref Range   Glucose-Capillary 213 (*) 70 - 99 mg/dL  GLUCOSE, CAPILLARY     Status: Abnormal   Collection Time    06/21/13  6:14 AM      Result Value Ref Range   Glucose-Capillary 196 (*) 70 - 99 mg/dL    Physical Findings: AIMS: Facial and Oral Movements Muscles of Facial Expression: None, normal Lips and Perioral Area: None, normal Jaw: None, normal Tongue: None, normal,Extremity Movements Upper (arms, wrists, hands, fingers): None, normal Lower (legs, knees, ankles, toes): None, normal, Trunk Movements Neck, shoulders, hips: None, normal, Overall Severity Severity of abnormal movements (highest score from questions above): None, normal Incapacitation due to abnormal movements: None, normal Patient's awareness of abnormal movements (rate only patient's report): No Awareness, Dental Status Current problems with teeth and/or dentures?: No Does patient usually wear dentures?: No  CIWA:    COWS:     Psychiatric Specialty Exam: See Psychiatric Specialty Exam and Suicide Risk Assessment completed by Attending Physician prior to discharge.  Discharge destination:  Home  Is patient on multiple antipsychotic therapies at discharge:  No   Has Patient had three or more failed trials of antipsychotic monotherapy by history:  No  Recommended Plan for Multiple Antipsychotic Therapies: NA  Discharge Instructions   Diet - low sodium heart healthy    Complete by:  As directed      Discharge instructions     Complete by:  As directed   Take all of your medications as directed. Be sure to keep all of your follow up appointments.  If you are unable to keep your follow up appointment, call your Doctor's office to let them know, and reschedule.  Make sure that you have enough medication to last until your appointment. Be sure to get plenty of rest. Going to bed at the same time each night will help. Try to avoid sleeping during the day.  Increase your activity as tolerated. Regular exercise will help you to sleep better and improve your mental health. Eating a heart healthy diet is recommended. Try to avoid  salty or fried foods. Be sure to avoid all alcohol and illegal drugs.     Increase activity slowly    Complete by:  As directed             Medication List    STOP taking these medications       Biotin 5000 MCG Caps     buPROPion 300 MG 24 hr tablet  Commonly known as:  WELLBUTRIN XL     escitalopram 20 MG tablet  Commonly known as:  LEXAPRO     LORazepam 0.5 MG tablet  Commonly known as:  ATIVAN     multivitamin with minerals Tabs tablet     ondansetron 4 MG tablet  Commonly known as:  ZOFRAN     VITAMIN B 12 PO      TAKE these medications     Indication   amLODipine 5 MG tablet  Commonly known as:  NORVASC  Take 1 tablet (5 mg total) by mouth daily.   Indication:  High Blood Pressure     aspirin 81 MG chewable tablet  Chew 1 tablet (81 mg total) by mouth daily.   Indication:  Heart Attack     Calcium Carbonate-Vitamin D 600-400 MG-UNIT per tablet  Commonly known as:  CALTRATE 600+D  Take 1 tablet by mouth daily.   Indication:  Low Amount of Calcium in the Blood     carvedilol 3.125 MG tablet  Commonly known as:  COREG  Take 1 tablet (3.125 mg total) by mouth 2 (two) times daily with a meal.   Indication:  High Blood Pressure of Unknown Cause     DULoxetine 30 MG capsule  Commonly known as:  CYMBALTA  Take 1 capsule (30 mg total) by mouth daily.   Indication:   Major Depressive Disorder     glimepiride 4 MG tablet  Commonly known as:  AMARYL  Take 1 tablet (4 mg total) by mouth daily.   Indication:  Type 2 Diabetes     metFORMIN 1000 MG tablet  Commonly known as:  GLUCOPHAGE  Take 1 tablet (1,000 mg total) by mouth daily with breakfast.   Indication:  Type 2 Diabetes     simvastatin 20 MG tablet  Commonly known as:  ZOCOR  Take 1 tablet (20 mg total) by mouth daily.   Indication:  Inherited Heterozygous Hypercholesterolemia     sucralfate 1 GM/10ML suspension  Commonly known as:  CARAFATE  Take 10 mLs (1 g total) by mouth 4 (four) times daily.   Indication:  Gastroesophageal Reflux Disease     traZODone 25 mg Tabs tablet  Commonly known as:  DESYREL  Take 0.5 tablets (25 mg total) by mouth at bedtime.   Indication:  Trouble Sleeping           Follow-up Information   [facility]. (Appointment scheduled at 2:00 pm with Charmian MuffAnn Evans for orientation on this date.  Start groups on Friday 06/24/13.  Groups are Monday, Wednesday, Friday 1 - 4 pm)    Contact information:   [address] [place], [place] [address] Phone: [phone]      Follow-up recommendations:   Activities: Resume activity as tolerated. Diet: Heart healthy low sodium diet Tests: Follow up testing will be determined by your out patient provider. Comments:    Total Discharge Time:  Less than 30 minutes.  Signed: Rona RavensNeil T. Mashburn RPAC 06/21/2013 12:01 pm  Patient was seen face-to-face for  psychiatric evaluation, suicide risk assessment and case discussed with the treatment team on physician extender. Appropriate disposition plan was made andReviewed the information documented and agree with the treatment plan.   Randal Buba Liliani Bobo 06/23/2013 4:13 PM

## 2013-06-21 NOTE — Progress Notes (Signed)
[facility] :  Will you be returning to the same living situation after discharge: Yes,  returning to own home with boyfriend's support At discharge, do you have transportation home?:Yes,  boyfriend will pick pt up Do you have the ability to pay for your medications:Yes,  provided pt with prescriptions and pt verbalizes ability to afford meds  Release of information consent forms completed and in the chart;  Patient's signature needed at discharge.  Patient to Follow up at: Follow-up Information   [facility] 06/23/2013. (Appointment scheduled at 2:00 pm with Charmian MuffAnn Evans for orientation on this date.  Start groups on Friday 06/24/13.  Groups are Monday, Wednesday, Friday 1 - 4 pm)    Contact information:   [address] [place], [place] [address] Phone: [phone]      Patient denies SI/HI:   Yes,  denies SI/HI    Safety Planning and Suicide Prevention discussed:  Yes,  discussed with pt and pt's boyfriend.  See suicide prevention education note.   Deegan Valentino N Horton 06/21/2013, 10:20 AM

## 2013-06-21 NOTE — [facility] Group Notes (Signed)
[facility] LCSW Group Therapy  06/21/2013   1:15 PM   Type of Therapy:  Group Therapy  Participation Level:  Active  Participation Quality:  Attentive, Sharing and Supportive  Affect:  Calm  Cognitive:  Alert and Oriented  Insight:  Developing/Improving and Engaged  Engagement in Therapy:  Developing/Improving and Engaged  Modes of Intervention:  Activity, Clarification, Confrontation, Discussion, Education, Exploration, Limit-setting, Orientation, Problem-solving, Rapport Building, Dance movement psychotherapisteality Testing, Socialization and Support  Summary of Progress/Problems: Patient was attentive and engaged with speaker from [employer].  Patient was attentive to speaker while they shared their story of dealing with mental health and overcoming it.  Patient expressed interest in their programs and services and received information on their agency.  Patient processed ways they can relate to the speaker.     Becky IvanChelsea Horton, LCSW 06/21/2013  1:47 PM

## 2013-06-21 NOTE — Progress Notes (Signed)
D: Pt is a pleasant 58 yr old female that reports an overall improvement in her mood. Pt presents with a clear thought process and plans for discharge. Pt reports readiness to refrain from using alcohol. Pt is looking forward to her IOP. Pt reports the ability to identify her anxiety as a separate problem from her depression. Pt verbalizes that she previously grouped her anxiety and depression into "one big ball". Pt is currently denying any SI/HI/AVH.  A: Writer administered scheduled and prn medications to pt. Continued support and availability as needed was extended to this pt. Staff continue to monitor pt with q9715min checks.  R: No adverse drug reactions noted. Pt receptive to treatment. Pt remains safe at this time.

## 2013-06-21 NOTE — Progress Notes (Signed)
Discharge Note:  Patient discharged home with husband.  Denied SI and HI.  Denied A/V hallucinations.  Denied pain.  Suicide prevention information given and discussed with patient who stated she understood and had no questions.  Patient stated she appreciated all [facility].  Patient stated she received all belongings, clothing, misc items, toiletries, prescriptions, medications, red suitcase, toothbrush, knee highs, conditioner, hangers, medications, sneakers.

## 2013-06-24 NOTE — Progress Notes (Signed)
Patient Discharge Instructions:  Next Level Care Provider Has Access to the EMR, 06/24/13 Records provided to [facility] via [facility]/Epic access.  Jerelene Redden, 06/24/2013, 2:47 PM

## 2013-06-24 NOTE — ED Provider Notes (Signed)
Medical screening examination/treatment/procedure(s) were performed by non-physician practitioner and as supervising physician I was immediately available for consultation/collaboration.   Hiren Peplinski L Claryssa Sandner, MD 06/24/13 1550 

## 2013-07-05 ENCOUNTER — Ambulatory Visit (INDEPENDENT_AMBULATORY_CARE_PROVIDER_SITE_OTHER): Payer: BC Managed Care – PPO | Admitting: Psychiatry

## 2013-07-05 DIAGNOSIS — F1020 Alcohol dependence, uncomplicated: Secondary | ICD-10-CM

## 2013-07-13 ENCOUNTER — Ambulatory Visit: Payer: BC Managed Care – PPO | Admitting: Psychiatry

## 2013-07-21 ENCOUNTER — Ambulatory Visit (HOSPITAL_COMMUNITY): Payer: Self-pay | Admitting: Psychiatry

## 2013-07-26 ENCOUNTER — Ambulatory Visit (INDEPENDENT_AMBULATORY_CARE_PROVIDER_SITE_OTHER): Payer: BC Managed Care – PPO | Admitting: Psychiatry

## 2013-07-26 DIAGNOSIS — F1020 Alcohol dependence, uncomplicated: Secondary | ICD-10-CM

## 2013-07-27 ENCOUNTER — Other Ambulatory Visit (HOSPITAL_COMMUNITY)
Admission: RE | Admit: 2013-07-27 | Discharge: 2013-07-27 | Disposition: A | Discharge status: Home / Self Care | Payer: BC Managed Care – PPO | Source: Ambulatory Visit | Attending: Obstetrics & Gynecology | Admitting: Obstetrics & Gynecology

## 2013-07-27 ENCOUNTER — Other Ambulatory Visit: Payer: Self-pay | Admitting: Obstetrics & Gynecology

## 2013-07-27 DIAGNOSIS — Z1151 Encounter for screening for human papillomavirus (HPV): Secondary | ICD-10-CM | POA: Insufficient documentation

## 2013-07-27 DIAGNOSIS — Z01419 Encounter for gynecological examination (general) (routine) without abnormal findings: Secondary | ICD-10-CM | POA: Insufficient documentation

## 2013-07-28 ENCOUNTER — Other Ambulatory Visit: Payer: Self-pay

## 2013-07-28 DIAGNOSIS — Z1231 Encounter for screening mammogram for malignant neoplasm of breast: Secondary | ICD-10-CM

## 2013-07-28 LAB — CYTOLOGY - PAP

## 2013-08-02 ENCOUNTER — Other Ambulatory Visit (HOSPITAL_COMMUNITY): Payer: Self-pay | Admitting: Physician Assistant

## 2013-08-09 ENCOUNTER — Ambulatory Visit (INDEPENDENT_AMBULATORY_CARE_PROVIDER_SITE_OTHER): Payer: BC Managed Care – PPO | Admitting: Psychiatry

## 2013-08-09 ENCOUNTER — Encounter (HOSPITAL_COMMUNITY): Payer: Self-pay | Admitting: Psychiatry

## 2013-08-09 VITALS — BP 113/68 | HR 90 | Ht 64.0 in | Wt 181.6 lb

## 2013-08-09 DIAGNOSIS — F1010 Alcohol abuse, uncomplicated: Secondary | ICD-10-CM | POA: Insufficient documentation

## 2013-08-09 DIAGNOSIS — F1011 Alcohol abuse, in remission: Secondary | ICD-10-CM

## 2013-08-09 DIAGNOSIS — F341 Dysthymic disorder: Secondary | ICD-10-CM

## 2013-08-09 DIAGNOSIS — F331 Major depressive disorder, recurrent, moderate: Secondary | ICD-10-CM

## 2013-08-09 MED ORDER — TRAZODONE HCL 50 MG PO TABS: ORAL_TABLET | ORAL | Status: DC

## 2013-08-09 MED ORDER — DULOXETINE HCL 60 MG PO CPEP: 60.0000 mg | ORAL_CAPSULE | Freq: Every day | ORAL | Status: DC
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
## 2013-08-10 ENCOUNTER — Ambulatory Visit (INDEPENDENT_AMBULATORY_CARE_PROVIDER_SITE_OTHER): Payer: BC Managed Care – PPO | Admitting: Psychiatry

## 2013-08-10 ENCOUNTER — Ambulatory Visit
Admission: RE | Admit: 2013-08-10 | Discharge: 2013-08-10 | Disposition: A | Discharge status: Home / Self Care | Payer: BC Managed Care – PPO | Source: Ambulatory Visit

## 2013-08-10 DIAGNOSIS — Z1231 Encounter for screening mammogram for malignant neoplasm of breast: Secondary | ICD-10-CM

## 2013-08-10 DIAGNOSIS — F1020 Alcohol dependence, uncomplicated: Secondary | ICD-10-CM

## 2013-08-12 ENCOUNTER — Ambulatory Visit (INDEPENDENT_AMBULATORY_CARE_PROVIDER_SITE_OTHER): Payer: BC Managed Care – PPO | Admitting: Neurology

## 2013-08-12 ENCOUNTER — Encounter: Payer: Self-pay | Admitting: Neurology

## 2013-08-12 VITALS — BP 110/74 | HR 89 | Resp 16 | Ht 64.0 in | Wt 180.0 lb

## 2013-08-12 DIAGNOSIS — R51 Headache: Secondary | ICD-10-CM

## 2013-08-12 DIAGNOSIS — R200 Anesthesia of skin: Secondary | ICD-10-CM

## 2013-08-12 DIAGNOSIS — I6350 Cerebral infarction due to unspecified occlusion or stenosis of unspecified cerebral artery: Secondary | ICD-10-CM

## 2013-08-12 DIAGNOSIS — R519 Headache, unspecified: Secondary | ICD-10-CM | POA: Insufficient documentation

## 2013-08-12 DIAGNOSIS — I639 Cerebral infarction, unspecified: Secondary | ICD-10-CM | POA: Insufficient documentation

## 2013-08-12 DIAGNOSIS — R413 Other amnesia: Secondary | ICD-10-CM

## 2013-08-12 DIAGNOSIS — R209 Unspecified disturbances of skin sensation: Secondary | ICD-10-CM

## 2013-08-12 NOTE — Patient Instructions (Signed)
1. MRI brain with and without contrast 2. MRA head without contrast 3. Routine EEG 4. Continue aspirin and control of BP, glucose, and cholesterol 5. Recommend starting assistive device such as cane for balance

## 2013-08-12 NOTE — Progress Notes (Addendum)
NEUROLOGY CONSULTATION NOTE  Becky Villarreal MRN: [id number] DOB: 1955/02/18  Referring provider: Dr. Feliciana Rossetti Primary care provider: Dr. Feliciana Rossetti  Reason for consult:  Recent stroke with confusion, right-sided numbness, worsening balance  Dear Dr Shary Decamp:  Thank you for your kind referral of Becky Villarreal for consultation of the above symptoms. Although her history is well known to you, please allow me to reiterate it for the purpose of our medical record. Records and images were personally reviewed where available. She had previously seen neurologist Dr. Craige Cotta at [facility], presenting for evaluation today to see a neurologist earlier than August.  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  At that time she had slurred speech, right-sided headache, left arm tingling, and vertigo.  MRI brain showed an acute infarct with DWI abnormality in the superior left frontal lobe.  There were multiple punctate areas of signal hyperintensity in the cerebral white matter consistent with chronic small vessel ischemic changes.  She reports stroke workup was done, carotid dopplers normal. She had an echo and 30-day event monitor but does not have results yet.  She was started on a daily baby aspirin and continues to work on BP with her PCP.  She underwent vestibular rehab for vertigo, with resolution of symptoms.  She presents today due to a change in symptoms since January.  She reports that she "is not the same person she was 6-7 months ago."  She has been more forgetful, with word-finding difficulties. She previously worked as Film/video editor, and is now a Geographical information systems officer, but has noticed difficulties doing her job.  She loses things and cannot find them.  She has noticed worsening balance, walking  is slower, and she has been told she looks like one leg is shorter when she walks fast.  When she stand up, she feels like she will pass out. Symptoms do not occur when sitting or standing.  No falls.  She had headaches in January which improved, however recently again increased in frequency, occurring daily over the right frontal and parietal regions, with sharp pain lasting for several hours.  She has been taking Tylenol and Ibuprofen multiple times a day now.  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  Over the past 2 days, she started having numbness and tingling over the right face, arm, and leg, "like I want to rub my skin."  She denies any focal weakness.    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  She took Inderal, discontinued when these symptoms resolved. She denies any diplopia, dysarthria, dysphagia, neck/back pain.  She started having urinary urgency and some incontinence, and started Myrbetriq. 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  There is no family history of migraines, strong family history of stroke.  Her maternal uncle has seizures.     Laboratory Data: Lab Results  Component Value Date   WBC 6.3 06/16/2013   HGB 14.0 06/16/2013   HCT 40.1 06/16/2013   MCV 98.5 06/16/2013   PLT 282 06/16/2013     Chemistry      Component Value Date/Time   NA 138 06/16/2013 1835   K 3.9 06/16/2013 1835   CL 99 06/16/2013 1835   CO2 24 06/16/2013 1835  BUN 12 06/16/2013 1835   CREATININE 0.59 06/16/2013 1835      Component Value Date/Time   CALCIUM 9.4 06/16/2013 1835   ALKPHOS 83 06/16/2013 1835   AST 92* 06/16/2013 1835   ALT 51* 06/16/2013 1835   BILITOT 0.5 06/16/2013 1835      PAST MEDICAL HISTORY: Past Medical History  Diagnosis Date  . Depression   . Diabetes mellitus without complication   . Hypertension   . Stroke   . Gastric ulcer   . Anxiety   . Alcohol abuse     PAST  SURGICAL HISTORY: Past Surgical History  Procedure Laterality Date  . Back surgery    . Cesarean section    . Tubal ligation    . Tonsillectomy    . Eye surgery    . Gastric bypass    . Cholecystectomy      MEDICATIONS: Current Outpatient Prescriptions on File Prior to Visit  Medication Sig Dispense Refill  . amLODipine (NORVASC) 5 MG tablet Take 1 tablet (5 mg total) by mouth daily.      Marland Kitchen aspirin 81 MG chewable tablet Chew 1 tablet (81 mg total) by mouth daily.      . Calcium Carbonate-Vitamin D (CALTRATE 600+D) 600-400 MG-UNIT per tablet Take 1 tablet by mouth daily.      . Canagliflozin (INVOKANA) 100 MG TABS Take 100 mg by mouth daily.      . carvedilol (COREG) 3.125 MG tablet Take 1 tablet (3.125 mg total) by mouth 2 (two) times daily with a meal.      . DULoxetine (CYMBALTA) 60 MG capsule Take 1 capsule (60 mg total) by mouth daily.  30 capsule  1  . glimepiride (AMARYL) 4 MG tablet Take 1 tablet (4 mg total) by mouth daily.      . metFORMIN (GLUCOPHAGE) 1000 MG tablet Take 1 tablet (1,000 mg total) by mouth daily with breakfast.      . mirabegron ER (MYRBETRIQ) 25 MG TB24 tablet Take 25 mg by mouth daily.      . simvastatin (ZOCOR) 20 MG tablet Take 1 tablet (20 mg total) by mouth daily.  30 tablet    . traZODone (DESYREL) 50 MG tablet Take 50-100mg  po qHS prn insomnia  60 tablet  1   No current facility-administered medications on file prior to visit.    ALLERGIES: Allergies  Allergen Reactions  . Sulfa Antibiotics Anaphylaxis  . Ambien [Zolpidem Tartrate] Other (See Comments)    Other reaction(s): Other MEMORY LOSS Causes memory loss    FAMILY HISTORY: Family History  Problem Relation Age of Onset  . Depression Mother   . Alcohol abuse Brother   . Alcohol abuse Maternal Grandfather   . Alcohol abuse Brother   . Alcohol abuse Brother   . Alcohol abuse Daughter   . Alcohol abuse Son   . Diabetes Mother   . Hypertension Mother   . Multiple sclerosis Mother     . Depression Mother     SOCIAL HISTORY: History   Social History  . Marital Status: Divorced    Spouse Name: N/A    Number of Children: N/A  . Years of Education: N/A   Occupational History  . Not on file.   Social History Main Topics  . Smoking status: Former Games developer  . Smokeless tobacco: Never Used  . Alcohol Use: No     Comment: 8 weeks sober  . Drug Use: No  . Sexual Activity: Not on file  Other Topics Concern  . Not on file   Social History Narrative  . No narrative on file    REVIEW OF SYSTEMS: Constitutional: No fevers, chills, or sweats, no generalized fatigue, change in appetite Eyes: No visual changes, double vision, eye pain Ear, nose and throat: No hearing loss, ear pain, nasal congestion, sore throat Cardiovascular: No chest pain, palpitations Respiratory:  No shortness of breath at rest or with exertion, wheezes GastrointestinaI: + nausea, no vomiting, diarrhea, abdominal pain, fecal incontinence Genitourinary:  No dysuria, urinary retention or frequency Musculoskeletal:  No neck pain, back pain Integumentary: No rash, pruritus, skin lesions Neurological: as above Psychiatric: + depression, insomnia, anxiety Endocrine: No palpitations, fatigue, diaphoresis, mood swings, change in appetite, change in weight, increased thirst Hematologic/Lymphatic:  No anemia, purpura, petechiae. Allergic/Immunologic: no itchy/runny eyes, nasal congestion, recent allergic reactions, rashes  PHYSICAL EXAM: Filed Vitals:   08/12/13 0806  BP: 110/74  Pulse: 89  Resp: 16   General: No acute distress Head:  Normocephalic/atraumatic Eyes: Fundoscopic exam shows bilateral sharp discs, no vessel changes, exudates, or hemorrhages Neck: supple, no paraspinal tenderness, full range of motion Back: No paraspinal tenderness Heart: regular rate and rhythm Lungs: Clear to auscultation bilaterally. Vascular: No carotid bruits. Skin/Extremities: No rash, no  edema Neurological Exam: Mental status: alert and oriented to person, place, and time, no dysarthria or aphasia, Fund of knowledge is appropriate.  Recent and remote memory are intact.  Attention and concentration are normal.    Able to name objects and repeat phrases. Cranial nerves: CN I: not tested CN II: pupils equal, round and reactive to light, visual fields intact, fundi unremarkable. CN III, IV, VI:  full range of motion, no nystagmus, no ptosis CN V: reports spreading tingling to pin and cold on right V1-3 but no decrease in sensation CN VII: upper and lower face symmetric CN VIII: hearing intact to finger rub CN IX, X: gag intact, uvula midline CN XI: sternocleidomastoid and trapezius muscles intact CN XII: tongue midline Bulk & Tone: normal, no fasciculations. Motor: 5/5 throughout with no pronator drift. Sensation: she denies any decreased sensation on the right UE and LE, but reports that sensation is different, with a spreading tingling sensation to pin and cold.  Intact vibration and joint position sense.  No extinction to double simultaneous stimulation.  Romberg test positive Deep Tendon Reflexes: +2 throughout except for absent ankle jerks bilaterally, no ankle clonus Plantar responses: downgoing bilaterally Cerebellar: no incoordination on finger to nose, heel to shin. No dysdiadochokinesia Gait: slow and cautious, no ataxia Tremor: none  IMPRESSION: This is a 58 year old right-handed woman with multiple medical problems including hypertension, hyperlipidemia, diabetes, recent small stroke in the superior left frontal lobe, anxiety, depression, presenting with worsening memory, episodes of loss of balance, and new right-sided sensory symptoms.  The etiology of her recent stroke is unclear, carotid doppler reported as normal, she had an echo and 30-day event monitor, results unavailable at this time and have been requested for review.  Concern is for new stroke with  right-sided symptoms.  MRI and MRA brain will be ordered. Subclinical seizures causing worsening memory is less likely, routine EEG will be done.  Continue aspirin, control of vascular risk factors for secondary stroke prevention.  We discussed balance issues and use of assistive devices, fall precautions.  She will follow-up in 1 month and knows to go to the ER if symptoms worsen.    Thank you for allowing me to participate in the  care of this patient. Please do not hesitate to call for any questions or concerns.   Patrcia DollyKaren Aquino, M.D.  CC: Dr. Shary DecampGrisso  Addendum: Labs from Dr. Anders GrantGrisso's office reviewed.  04/04/2013: LDL 82, HbA1c 9.0

## 2013-08-13 ENCOUNTER — Encounter: Payer: Self-pay | Admitting: Neurology

## 2013-08-23 ENCOUNTER — Telehealth: Payer: Self-pay | Admitting: Neurology

## 2013-08-23 NOTE — Telephone Encounter (Signed)
Pt has a f/u appt on 09/12/13 however pt is no longer employed and her insurance will end on 09/02/13. She would like to confirm if it's okay for her to mover her f/u appt before 09/02/13. C/B [phone]

## 2013-08-23 NOTE — Telephone Encounter (Signed)
Okay, as long as she has the MRI and EEG before the visit so we can discuss results. Thanks

## 2013-08-25 ENCOUNTER — Ambulatory Visit (INDEPENDENT_AMBULATORY_CARE_PROVIDER_SITE_OTHER): Payer: BC Managed Care – PPO | Admitting: Neurology

## 2013-08-25 DIAGNOSIS — R413 Other amnesia: Secondary | ICD-10-CM

## 2013-08-25 DIAGNOSIS — R200 Anesthesia of skin: Secondary | ICD-10-CM

## 2013-08-25 DIAGNOSIS — I6350 Cerebral infarction due to unspecified occlusion or stenosis of unspecified cerebral artery: Secondary | ICD-10-CM

## 2013-08-25 DIAGNOSIS — I639 Cerebral infarction, unspecified: Secondary | ICD-10-CM

## 2013-08-25 DIAGNOSIS — R51 Headache: Secondary | ICD-10-CM

## 2013-08-26 NOTE — Procedures (Signed)
ELECTROENCEPHALOGRAM REPORT  Date of Study: 08/25/2013  Patient's Name: Becky Villarreal MRN: [id number] Date of Birth: 1955-06-02  Referring Provider: Dr. Patrcia DollyKaren Suetta Hoffmeister  Clinical History: This is a 58 year old woman with recent small stroke in the superior left frontal lobe, with worsening memory, episodes of loss of balance, and new right-sided sensory symptoms. EEG to assess for subclinical seizures.  Medications: Norvasc, aspirin, Invokana, Trazodone, Cymbalta, Coreg, Amaryl, Glucophage, Zocor  Technical Summary: A multichannel digital EEG recording measured by the international 10-20 system with electrodes applied with paste and impedances below 5000 ohms performed in our laboratory with EKG monitoring in an awake and asleep patient.  Hyperventilation and photic stimulation were performed.  The digital EEG was referentially recorded, reformatted, and digitally filtered in a variety of bipolar and referential montages for optimal display.  Spike detection software was employed.  Description: The patient is awake and asleep during the recording.  During maximal wakefulness, there is a symmetric, medium voltage 9.5 Hz posterior dominant rhythm that attenuates with eye opening.  The record is symmetric.  During drowsiness and stage I sleep, there is an increase in theta slowing of the background, with shifting asymmetry over the bilateral temporal regions.  Occasional vertex waves were seen.  Hyperventilation and photic stimulation did not elicit any abnormalities.  There were no epileptiform discharges or electrographic seizures seen.    EKG lead was unremarkable.  Impression: This awake and asleep EEG is normal.    Clinical Correlation: A normal EEG does not exclude a clinical diagnosis of epilepsy.  If further clinical questions remain, prolonged EEG may be helpful.  Clinical correlation is advised.   Patrcia DollyKaren Wilkin Lippy, M.D.

## 2013-08-30 ENCOUNTER — Encounter (HOSPITAL_COMMUNITY): Payer: Self-pay | Admitting: Psychiatry

## 2013-08-30 ENCOUNTER — Ambulatory Visit (HOSPITAL_COMMUNITY)
Admission: RE | Admit: 2013-08-30 | Discharge: 2013-08-30 | Disposition: A | Discharge status: Home / Self Care | Payer: BC Managed Care – PPO | Source: Ambulatory Visit | Attending: Neurology | Admitting: Neurology

## 2013-08-30 ENCOUNTER — Ambulatory Visit (INDEPENDENT_AMBULATORY_CARE_PROVIDER_SITE_OTHER): Payer: BC Managed Care – PPO | Admitting: Psychiatry

## 2013-08-30 ENCOUNTER — Telehealth: Payer: Self-pay | Admitting: Family Medicine

## 2013-08-30 VITALS — BP 136/74 | HR 117 | Ht 64.0 in | Wt 184.4 lb

## 2013-08-30 DIAGNOSIS — R209 Unspecified disturbances of skin sensation: Secondary | ICD-10-CM | POA: Insufficient documentation

## 2013-08-30 DIAGNOSIS — F331 Major depressive disorder, recurrent, moderate: Secondary | ICD-10-CM

## 2013-08-30 DIAGNOSIS — R200 Anesthesia of skin: Secondary | ICD-10-CM

## 2013-08-30 DIAGNOSIS — R51 Headache: Secondary | ICD-10-CM | POA: Insufficient documentation

## 2013-08-30 DIAGNOSIS — I6529 Occlusion and stenosis of unspecified carotid artery: Secondary | ICD-10-CM | POA: Insufficient documentation

## 2013-08-30 DIAGNOSIS — R413 Other amnesia: Secondary | ICD-10-CM | POA: Insufficient documentation

## 2013-08-30 DIAGNOSIS — F1010 Alcohol abuse, uncomplicated: Secondary | ICD-10-CM

## 2013-08-30 DIAGNOSIS — I658 Occlusion and stenosis of other precerebral arteries: Secondary | ICD-10-CM | POA: Insufficient documentation

## 2013-08-30 DIAGNOSIS — F329 Major depressive disorder, single episode, unspecified: Secondary | ICD-10-CM

## 2013-08-30 DIAGNOSIS — I639 Cerebral infarction, unspecified: Secondary | ICD-10-CM

## 2013-08-30 LAB — POCT I-STAT CREATININE: Creatinine, Ser: 0.8 mg/dL (ref 0.50–1.10)

## 2013-08-30 MED ORDER — DULOXETINE HCL 60 MG PO CPEP: 60.0000 mg | ORAL_CAPSULE | Freq: Every day | ORAL | Status: DC

## 2013-08-30 MED ORDER — GADOBENATE DIMEGLUMINE 529 MG/ML IV SOLN
17.0000 mL | Freq: Once | INTRAVENOUS | Status: AC | PRN
  Administered 2013-08-30: 17 mL via INTRAVENOUS

## 2013-08-30 MED ORDER — TRAZODONE HCL 100 MG PO TABS: ORAL_TABLET | ORAL | Status: AC

## 2013-08-30 NOTE — Progress Notes (Signed)
[facility] [phone] Progress Note  Becky Villarreal [id number] 58 y.o.  08/30/2013 3:16 PM  Chief Complaint: started drinking again  History of Present Illness: Pt was fired on July 17th b/c she wasn't meeting her goals. States goals were met but they weren't happy with her work. Pt is losing her insurance at the end of this week. She is applying for a lot of jobs but hasn't gotten any response. Pt believes she is being blackballed. Finances are a huge stressors.   States she needs something to calm her nerves. She is asking for Klonopin.   Pt is very depressed and tearful. States she is numb and isn't sure how much more she can handle. Sleep is ok with Trazodone. Pt is getting about 9 hrs/night. Appetite is fair. Energy is good and she is active. Pt is motivated to find a job. She is trying to be social with neighbors.   Pt is drinking again as of this week b/c anxiety is high. She is drinking 1.5 litres or 4 pack of wine several times a week. Pt states her family is unhappy with her drinking and are not supportive of her depression. Pt doesn't want to drink but it provides comfort and helps her sleep. States it makes her numb.  Pt is taking Cymbalta and Trazodone as prescribed and denies SE.   Suicidal Ideation: No but is having passive thoughts of death "it wouldn't matter if I wasn't around anymore" Plan Formed: No Patient has means to carry out plan: No  Homicidal Ideation: No Plan Formed: No Patient has means to carry out plan: No  Review of Systems: Psychiatric: Agitation: No Hallucination: No Depressed Mood: Yes Insomnia: No Hypersomnia: No Altered Concentration: Yes Feels Worthless: Yes Grandiose Ideas: No Belief In Special Powers: No New/Increased Substance Abuse: Yes Compulsions: No  Neurologic: Headache: No Seizure: No EEG was negative. MRI/MRA pending for symptoms including balance and word finding Paresthesias: Yes related to DM neuropathy  Past  Medical, Family, Social History: Pt quit smoking in 1993. She denies drug use. Pt is drinking alcohol again. Reports family hx of depression and alcohol abuse. Pt is living in [place] with her boyfriend. She is divorced and was married for 28 yrs. Pt has one son and one daughter. 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   Outpatient Encounter Prescriptions as of 08/30/2013  Medication Sig  . amLODipine (NORVASC) 5 MG tablet Take 1 tablet (5 mg total) by mouth daily.  Marland Kitchen aspirin 81 MG chewable tablet Chew 1 tablet (81 mg total) by mouth daily.  . Calcium Carbonate-Vitamin D (CALTRATE 600+D) 600-400 MG-UNIT per tablet Take 1 tablet by mouth daily.  . Canagliflozin (INVOKANA) 100 MG TABS Take 100 mg by mouth daily.  . carvedilol (COREG) 3.125 MG tablet Take 1 tablet (3.125 mg total) by mouth 2 (two) times daily with a meal.  . DULoxetine (CYMBALTA) 60 MG capsule Take 1 capsule (60 mg total) by mouth daily.  Marland Kitchen glimepiride (AMARYL) 4 MG tablet Take 1 tablet (4 mg total) by mouth daily.  . metFORMIN (GLUCOPHAGE) 1000 MG tablet Take 1 tablet (1,000 mg total) by mouth daily with breakfast.  . mirabegron ER (MYRBETRIQ) 25 MG TB24 tablet Take 25 mg by mouth daily.  . simvastatin (ZOCOR) 20 MG tablet Take 1 tablet (20 mg total) by mouth daily.  . traZODone (DESYREL) 50 MG tablet Take 50-152m po qHS prn insomnia    Past Psychiatric History/Hospitalization(s):  Anxiety: Yes Bipolar Disorder: No Depression: Yes Mania: No Psychosis: No Schizophrenia: No Personality Disorder: No Hospitalization for psychiatric illness: Yes History of Electroconvulsive Shock Therapy: No Prior Suicide Attempts: Yes  Physical Exam: Constitutional:  BP 136/74  Pulse 117  Ht 5' 4"  (1.626 m)  Wt 184 lb 6.4 oz (83.643 kg)  BMI 31.64 kg/m2  General Appearance: alert, oriented, no acute distress  Musculoskeletal: Strength & Muscle Tone: within normal limits Gait & Station:  normal Patient leans: N/A  Mental Status Examination/Evaluation: Objective: Attitude: Calm and cooperative  Appearance: Casual  Eye Contact::  Fair  Speech:  Clear and Coherent and Normal Rate  Volume:  Normal  Mood:  depressed  Affect:  Congruent and Tearful  Thought Process:  Linear and Logical  Orientation:  Full (Time, Place, and Person)  Thought Content:  WDL  Suicidal Thoughts:  No  Homicidal Thoughts:  No  Judgement:  Fair  Insight:  Fair  Concentration: good  Memory: Immediate- fair Recent- fair Remote- fair  Recall: fair  Language: fair  Gait and Station: normal  [employer] of Knowledge: average  Psychomotor Activity:  Normal  Akathisia:  No  Handed:  Right  AIMS (if indicated):  n/a     Medical Decision Making (Choose Three): Review of Psycho-Social Stressors (1), Established Problem, Worsening (2) and Review of Medication Regimen & Side Effects (2)   Assessment: AXIS I  MDD with anxiety, alcohol abuse   AXIS II  Deferred   AXIS III  Past Medical History    Diagnosis  Date    .  Depression     .  Diabetes mellitus without complication     .  Hypertension     .  Stroke     .  Gastric ulcer     .  Anxiety     .  Alcohol abuse    AXIS IV  occupational problems and other psychosocial or environmental problems   AXIS V  51-60 moderate symptoms       Treatment Plan/Recommendations:  Plan of Care: Medication management with supportive therapy. Risks/benefits and SE of the medication discussed. Pt verbalized understanding and verbal consent obtained for treatment. Affirm with the patient that the medications are taken as ordered. Patient expressed understanding of how their medications were to be used.   Laboratory: none at this time   Psychotherapy: Therapy: brief supportive therapy provided. Discussed psychosocial stressors in detail.  - encouraged pt to stop drinking alcohol   Medications: Cymbalta 78m po qD for mood and anxiety Trazodone 1055mpo  qHS prn insomnia  -declined Klonopin request due to alcohol abuse  Routine PRN Medications: Yes   Consultations: encouraged to f/up with PCP and endocrinologist. Encouraged to continue individual therapy   Safety Concerns: Pt denies SI but is endorsing passive thoughts of death and is at an acute low risk for suicide. Patient told to call clinic if any problems occur. Patient advised to go to ER if they should develop SI/HI, side effects, or if symptoms worsen. Has crisis numbers to call if needed. Pt verbalized understanding.     Other: F/up in 2 months or sooner if needed. Pt states if she doesn't find another job or get insurance then she is will not f/up due to financial concerns.      AGCharlcie CradleMD 08/30/2013

## 2013-08-30 NOTE — Telephone Encounter (Signed)
Called patient to let her know that both MRI Brain & MRA Head have been approved and she can proceed with scans as scheduled. She is scheduled @ [facility] tonight at 9:00pm.  Dr. Karel JarvisAquino did do peer to peer review with approval code [id number] valid until 09/24/13.

## 2013-08-31 ENCOUNTER — Other Ambulatory Visit: Payer: Self-pay

## 2013-09-02 ENCOUNTER — Ambulatory Visit (INDEPENDENT_AMBULATORY_CARE_PROVIDER_SITE_OTHER): Payer: BC Managed Care – PPO | Admitting: Neurology

## 2013-09-02 ENCOUNTER — Ambulatory Visit (HOSPITAL_COMMUNITY): Payer: BC Managed Care – PPO

## 2013-09-02 ENCOUNTER — Other Ambulatory Visit (HOSPITAL_COMMUNITY): Payer: Self-pay

## 2013-09-02 ENCOUNTER — Encounter: Payer: Self-pay | Admitting: Neurology

## 2013-09-02 VITALS — BP 130/70 | HR 107 | Ht 64.0 in | Wt 181.7 lb

## 2013-09-02 DIAGNOSIS — R413 Other amnesia: Secondary | ICD-10-CM

## 2013-09-02 DIAGNOSIS — M25571 Pain in right ankle and joints of right foot: Secondary | ICD-10-CM

## 2013-09-02 DIAGNOSIS — M25579 Pain in unspecified ankle and joints of unspecified foot: Secondary | ICD-10-CM

## 2013-09-02 DIAGNOSIS — R2681 Unsteadiness on feet: Secondary | ICD-10-CM | POA: Insufficient documentation

## 2013-09-02 DIAGNOSIS — R269 Unspecified abnormalities of gait and mobility: Secondary | ICD-10-CM

## 2013-09-02 MED ORDER — GABAPENTIN 300 MG PO CAPS: ORAL_CAPSULE | ORAL | Status: AC

## 2013-09-02 MED ORDER — MELOXICAM 7.5 MG PO TABS: 7.5000 mg | ORAL_TABLET | Freq: Every day | ORAL | Status: AC

## 2013-09-02 NOTE — Progress Notes (Addendum)
NEUROLOGY FOLLOW UP OFFICE NOTE  Becky Villarreal [id number]  HISTORY OF PRESENT ILLNESS: I had the pleasure of seeing Becky Villarreal in follow-up in the neurology clinic on 09/02/2013.  The patient was last seen 2 weeks ago for right-sided numbness and memory changes.  She returns for an earlier follow-up due to losing her job and insurance at the end of this month.  I personally reviewed MRI and MRA brain which did not show any evidence of acute infarct, no acute changes, there is minimal chronic microvascular disease, no significant stenosis in the intracranial vasculature.  She had recently seen her psychiatrist and endorsed increased depression and return to drinking alcohol.  She continues to have memory problems and word-finding difficulties, replacing a word with a different one and knowing she is saying the wrong thing.  She has constant numbness and tingling in her left arm. When she was younger, she was told she had degenerative disease in the neck.  She also reports localized pain in the right medial malleolus. She has been seeing PT and was told that this is not an orthopedic issue.  There is tenderness to palpation, sometimes shooting up her body, she has tried Advil and capsaicin cream with no relief.  She is now unable to do PT due to insurance issues and does the home exercises and goes to the pool 3x/week.  She is trying to lead a healthy lifestyle and has been trying to keep positive.    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 At that time she had slurred speech, right-sided headache, left arm tingling, and vertigo. MRI brain showed an acute infarct with DWI abnormality in the superior left frontal lobe. There were multiple punctate areas of signal hyperintensity in the cerebral white matter consistent with chronic small vessel ischemic changes. She  reports stroke workup was done, carotid dopplers normal. She had an echo and 30-day event monitor but does not have results yet. She was started on a daily baby aspirin and continues to work on BP with her PCP. She underwent vestibular rehab for vertigo, with resolution of symptoms.   Since January, she reports that she "is not the same person she was 6-7 months ago." She has been more forgetful, with word-finding difficulties. She previously worked as Film/video editor, and is now a Geographical information systems officer, but has noticed difficulties doing her job. She loses things and cannot find them. She has noticed worsening balance, walking is slower, and she has been told she looks like one leg is shorter when she walks fast. When she stand up, she feels like she will pass out. Symptoms do not occur when sitting or standing. No falls. She had headaches in January which improved, however recently again increased in frequency, occurring daily over the right frontal and parietal regions, with sharp pain lasting for several hours. She has been taking Tylenol and Ibuprofen multiple times a day now. 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 Over the past 2 days, she started having numbness and tingling over the right face, arm, and leg, "like I want to rub my skin." She denies any focal weakness.   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 She took Inderal, discontinued when these symptoms resolved. She denies any diplopia, dysarthria, dysphagia, neck/back pain.  She started having urinary urgency and some incontinence, and started Myrbetriq. 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 There is no family history of migraines, strong family history of stroke. Her maternal uncle has seizures.   PAST MEDICAL HISTORY: Past Medical History  Diagnosis Date  . Depression   . Diabetes mellitus without complication   . Hypertension    . Stroke   . Gastric ulcer   . Anxiety   . Alcohol abuse     MEDICATIONS: Current Outpatient Prescriptions on File Prior to Visit  Medication Sig Dispense Refill  . amLODipine (NORVASC) 5 MG tablet Take 1 tablet (5 mg total) by mouth daily.      Marland Kitchen aspirin 81 MG chewable tablet Chew 1 tablet (81 mg total) by mouth daily.      . Calcium Carbonate-Vitamin D (CALTRATE 600+D) 600-400 MG-UNIT per tablet Take 1 tablet by mouth daily.      . Canagliflozin (INVOKANA) 100 MG TABS Take 100 mg by mouth daily.      . carvedilol (COREG) 3.125 MG tablet Take 1 tablet (3.125 mg total) by mouth 2 (two) times daily with a meal.      . DULoxetine (CYMBALTA) 60 MG capsule Take 1 capsule (60 mg total) by mouth daily.  60 capsule  0  . glimepiride (AMARYL) 4 MG tablet Take 1 tablet (4 mg total) by mouth daily.      . metFORMIN (GLUCOPHAGE) 1000 MG tablet Take 1 tablet (1,000 mg total) by mouth daily with breakfast.      . mirabegron ER (MYRBETRIQ) 25 MG TB24 tablet Take 25 mg by mouth daily.      . simvastatin (ZOCOR) 20 MG tablet Take 1 tablet (20 mg total) by mouth daily.  30 tablet    . traZODone (DESYREL) 100 MG tablet Take 50-100mg  po qHS prn insomnia  60 tablet  0   No current facility-administered medications on file prior to visit.    ALLERGIES: Allergies  Allergen Reactions  . Sulfa Antibiotics Anaphylaxis  . Ambien [Zolpidem Tartrate] Other (See Comments)    Other reaction(s): Other MEMORY LOSS Causes memory loss    FAMILY HISTORY: Family History  Problem Relation Age of Onset  . Depression Mother   . Alcohol abuse Brother   . Alcohol abuse Maternal Grandfather   . Alcohol abuse Brother   . Alcohol abuse Brother   . Alcohol abuse Daughter   . Alcohol abuse Son   . Diabetes Mother   . Hypertension Mother   . Multiple sclerosis Mother   . Depression Mother     SOCIAL HISTORY: History   Social History  . Marital Status: Divorced    Spouse Name: N/A    Number of Children:  N/A  . Years of Education: N/A   Occupational History  . Not on file.   Social History Main Topics  . Smoking status: Former Smoker    Quit date: 08/31/1991  . Smokeless tobacco: Never Used  . Alcohol Use: Yes     Comment: started this week= 1.5 litre or 4 pack of wine  . Drug Use: No  . Sexual Activity: Not on file   Other Topics Concern  . Not on file   Social History Narrative  . No narrative on file    REVIEW OF SYSTEMS: Constitutional: No fevers, chills, or sweats, no generalized fatigue, change in appetite Eyes: No visual changes, double vision, eye pain Ear, nose and throat: No hearing loss, ear pain, nasal congestion,  sore throat Cardiovascular: No chest pain, palpitations Respiratory:  No shortness of breath at rest or with exertion, wheezes GastrointestinaI: No nausea, vomiting, diarrhea, abdominal pain, fecal incontinence Genitourinary:  No dysuria, urinary retention or frequency Musculoskeletal:  No neck pain, back pain Integumentary: No rash, pruritus, skin lesions Neurological: as above Psychiatric: + depression, insomnia, anxiety Endocrine: No palpitations, fatigue, diaphoresis, mood swings, change in appetite, change in weight, increased thirst Hematologic/Lymphatic:  No anemia, purpura, petechiae. Allergic/Immunologic: no itchy/runny eyes, nasal congestion, recent allergic reactions, rashes  PHYSICAL EXAM: Filed Vitals:   09/02/13 0847  BP: 130/70  Pulse: 107   General: No acute distress Head:  Normocephalic/atraumatic Neck: supple, no paraspinal tenderness, full range of motion Heart:  Regular rate and rhythm Lungs:  Clear to auscultation bilaterally Back: No paraspinal tenderness Skin/Extremities: No rash, no edema Neurological Exam: alert and oriented to person, place, and time. No aphasia or dysarthria. Fund of knowledge is appropriate.  Recent and remote memory are intact.  Attention and concentration are normal.    Able to name objects and  repeat phrases. Cranial nerves: Pupils equal, round, reactive to light.  Fundoscopic exam unremarkable, no papilledema. Extraocular movements intact with no nystagmus. Visual fields full. Facial sensation intact to light touch. No facial asymmetry. Tongue, uvula, palate midline.  Motor: Bulk and tone normal, muscle strength 5/5 throughout with no pronator drift.  Sensation to light touch. No extinction to double simultaneous stimulation. Deep Tendon Reflexes: +2 throughout except for absent ankle jerks bilaterally, no ankle clonus. Plantar responses: downgoing bilaterally  Cerebellar: no incoordination on finger to nose. Gait: slow and cautious, no ataxia Tremor: none   IMPRESSION:  This is a 58 yo RH woman with multiple medical problems including hypertension, hyperlipidemia, diabetes, recent small stroke in the superior left frontal lobe, anxiety, depression, who presented with worsening memory, episodes of loss of balance, and new right-sided sensory symptoms.  MRI brain does not show any acute infarct. Right-sided symptoms may relate to cervical disease, no weakness noted on exam.  Continue PT exercises at home.  We discussed balance issues possibly due to neuropathy, she does have constant numbness and tingling as well in her left arm.  She will start a membrane stabilizing agent, gabapentin for symptomatic treatment. Side effects were discussed. She will slowly uptitrate as tolerated.  The pain in the medial malleolus is localized and possibly due to arthritis, she will try Mobic.  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 Follow-up with psychiatry as scheduled.  She will continue aspirin, control of vascular risk factors for secondary stroke prevention. We discussed balance issues and use of assistive devices, fall precautions, she does not feel safe with a cane and will be prescribed a rolling walker. She is losing her  insurance and will follow-up with us on a prn basis and knows to call our office for any problems.    Thank you for allowing me to participate in her care.  Please do not hesitate to call for any questions or concerns.  The duration of this appointment visit was 25 minutes of face-to-face time with the patient.  Greater than 50% of this time was spent in counseling, explanation of diagnosis, planning of further management, and coordination of care.   Patrcia DollyKaren Belmira Daley, M.D.   CC: Dr. Shary DecampGrisso  Addendum 10/03/2013:  Cardiac Event Monitor Report received, conclusion: Unremarkable event monitor, reported symptoms do not correlate with any arrhythmias.

## 2013-09-02 NOTE — Patient Instructions (Addendum)
1. Start Gabapentin 300mg : Take 1 cap at bedtime, increase slowly as instructed 2. Start Mobic 7.5mg  daily for ankle pain 3. Physical exercise and brain stimulation exercises are important for brain health 4. Continue PT exercises at home

## 2013-09-14 ENCOUNTER — Ambulatory Visit: Payer: Self-pay | Admitting: Neurology

## 2013-10-11 ENCOUNTER — Telehealth (HOSPITAL_COMMUNITY): Payer: Self-pay | Admitting: *Deleted

## 2013-10-11 ENCOUNTER — Other Ambulatory Visit (HOSPITAL_COMMUNITY): Payer: Self-pay | Admitting: *Deleted

## 2013-10-11 ENCOUNTER — Ambulatory Visit (HOSPITAL_COMMUNITY): Payer: Self-pay | Admitting: Psychiatry

## 2013-10-11 DIAGNOSIS — F331 Major depressive disorder, recurrent, moderate: Secondary | ICD-10-CM

## 2013-10-11 MED ORDER — DULOXETINE HCL 60 MG PO CPEP: 60.0000 mg | ORAL_CAPSULE | Freq: Every day | ORAL | Status: AC

## 2013-10-11 NOTE — Telephone Encounter (Signed)
Please refill Cymbalta 60 tabs. No refills on Trazodone.

## 2013-10-11 NOTE — Telephone Encounter (Signed)
Patient left [id number] even though pharmcy sent refill request for Trazodone and Cymbalta, only needs Cymbalta sent to [facility] pharmacy.Still has no job and no insurnace.MD authorized refil-30 day supply with one refill.  Phoned patient and left message regarding refill of Cymbalta. Advised pt that MD concerned about her [facility] offers financial aid if that would help her, or if needed, go to [facility] if still no job/insurance in 2 months.

## 2013-10-11 NOTE — Telephone Encounter (Signed)
Patient left [id number] sent faxes - Needs refill of Cymbalta - 60 mg.Trazodone not needed right now.No Job or insurnace, so has not been able to come.Knows she needs to schedule an appt

## 2013-10-17 NOTE — Telephone Encounter (Signed)
Cymbalta refilled per MD order. Contacted patient:Informed of refill and  encouraged her to continue mental health care with [facility] or apply for financial aide with [facility].

## 2015-01-02 ENCOUNTER — Encounter: Attending: Neurology | Primary: Physician Assistant

## 2015-01-03 ENCOUNTER — Ambulatory Visit: Admit: 2015-01-03 | Payer: PRIVATE HEALTH INSURANCE | Attending: Neurology | Primary: Physician Assistant

## 2015-01-03 DIAGNOSIS — R519 Headache, unspecified: Secondary | ICD-10-CM

## 2015-01-03 NOTE — Progress Notes (Signed)
PRESCRIPTION REFILL POLICY    [facility]   Statement to Patients  May 04, 2012      In an effort to ensure the large volume of patient prescription refills is processed in the most efficient and expeditious manner, we are asking our patients to assist us by calling your Pharmacy for all prescription refills, this will include also your  Mail Order Pharmacy. The pharmacy will contact our office electronically to continue the refill process.    Please do not wait until the last minute to call your pharmacy. We need at least 48 hours (2days) to fill prescriptions. We also encourage you to call your pharmacy before going to pick up your prescription to make sure it is ready.     With regard to controlled substance prescription refill requests (narcotic refills) that need to be picked up at our office, we ask your cooperation by providing us with at least 72 hours (3days) notice that you will need a refill.    We will not refill narcotic prescription refill requests after 4:00pm on any weekday, Monday through Thursday, or after 2:00pm on Fridays, or on the weekends.      We encourage everyone to explore another way of getting your prescription refill request processed using MyChart, our patient web portal through our electronic medical record system. MyChart is an efficient and effective way to communicate your medication request directly to the office and  downloadable as an app on your smart phone . MyChart also features a review functionality that allows you to view your medication list as well as leave messages for your physician. Are you ready to get connected? If so please review the attatched instructions or speak to any of our staff to get you set up right away!    Thank you so much for your cooperation. Should you have any questions please contact our Practice Administrator.    The Physicians and Staff,   Neurology Clinic

## 2015-01-03 NOTE — Progress Notes (Signed)
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    HPI:     2 yrs ago - had a stroke in [place] that caused vertigo, MRI was (+) per patient but does not remember exactly  - had vestibular therapy with benefit.    (+) headache - R parietal and bilateral temple, daily, lasting the whole day, Tylenol helps, (+) nausea (-) vomiting (-) photophobia (-) phonophobia    Has not seen a doctor for 1 yr due to insurance issues.    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         Past Medical Hx  Past Medical History   Diagnosis Date   ??? Depression    ??? Diabetes ([place])    ??? Headache    ??? Hypertension    ??? Stool color black    ??? 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 Marital status: DIVORCED     Spouse name: N/A   ??? Number of children: N/A   ??? Years of education: N/A     Social History Main Topics   ??? Smoking status: Former Smoker   ??? Smokeless tobacco: None   ??? Alcohol use Yes   ??? Drug use: No   ??? Sexual activity: Yes     Other Topics Concern   ??? None     Social History Narrative   (+) 3-4 glasses wine/ night    Family Hx  Family History   Problem Relation Age of Onset   ??? Diabetes Mother    ??? Hypertension Mother    ??? Stroke Maternal Grandmother        ALLERGIES  Allergies   Allergen Reactions   ??? Ambien [Zolpidem] Other (comments)     Memory loss   ??? Metformin Nausea Only   ??? Sulfa (Sulfonamide Antibiotics) Other (comments)     Throat closes up       CURRENT MEDS   Current Outpatient Prescriptions   Medication Sig Dispense Refill   ??? aspirin delayed-release 81 mg tablet Take  by mouth daily.     ??? lisinopril (PRINIVIL, ZESTRIL) 20 mg tablet Take  by mouth daily.     ??? DULoxetine (CYMBALTA) 60 mg capsule Take 60 mg by mouth daily.     ??? b complex vitamins (B COMPLEX 1) tablet Take 1 Tab by mouth daily.     ??? glimepiride (AMARYL) 4 mg tablet Take  by mouth every morning.     ??? buPROPion XL (WELLBUTRIN XL) 300 mg XL tablet Take 300 mg by mouth every morning.     ??? acetaminophen (TYLENOL EXTRA STRENGTH) 500 mg tablet Take  by mouth every six (6) hours as needed  for Pain.     ??? multivitamin (ONE A DAY) tablet Take 1 Tab by mouth daily.       ??? calcium citrate 200 mg (950 mg) tablet Take  by mouth daily.       ??? HYDROcodone-acetaminophen (NORCO) 7.5-325 mg per tablet Take 1 Tab by mouth every six (6) hours as needed for Pain. 30 Tab 0   ??? ondansetron hcl (ZOFRAN) 8 mg tablet Take 1 Tab by mouth every eight (8) hours as needed for Nausea. 20 Tab 0   ??? ondansetron (ZOFRAN ODT) 8 mg disintegrating tablet Take 1 Tab by mouth every eight (8) hours as needed for Nausea. 15 Tab 0   ??? metFORMIN (GLUCOPHAGE) 1,000 mg tablet Take 1,000 mg by mouth two (2) times daily (with meals).       ??? citalopram (CELEXA) 40 mg tablet Take 40 mg by mouth daily.       ??? simvastatin (ZOCOR) 40 mg tablet Take  by mouth nightly.       ??? cyanocobalamin (VITAMIN B-12) 500 mcg tablet Take 500 mcg by mouth daily.               PREVIOUS WORKUP: (reviewed)  IMAGING:    CT Results (recent):      MRI Results (recent):  No results found for this or any previous visit.    IR Results (recent):  No results found for this or any previous visit.    VAS/US Results (recent):  No results found for this or any previous visit.        LABS (reviewed)  Results for orders placed or performed during the hospital encounter of 08/20/11   CBC WITH AUTOMATED DIFF   Result Value Ref Range    WBC 9.2 3.6 - 11.0 K/uL     RBC 5.29 (H) 3.80 - 5.20 M/uL    HGB 17.6 (H) 11.5 - 16.0 g/dL    HCT 49.1 (H) 35.0 - 47.0 %    MCV 92.8 80.0 - 99.0 FL    MCH 33.3 26.0 - 34.0 PG    MCHC 35.8 30.0 - 36.5 g/dL    RDW 12.6 11.5 - 14.5 %    PLATELET 298 150 - 400 K/uL    NEUTROPHILS 61 32 - 75 %    LYMPHOCYTES 31 12 - 49 %    MONOCYTES 7 5 - 13 %    EOSINOPHILS 1 0 - 7 %    BASOPHILS 0 0 - 1 %    ABS. NEUTROPHILS 5.6 1.8 - 8.0 K/UL    ABS. LYMPHOCYTES 2.9 0.8 - 3.5 K/UL    ABS. MONOCYTES 0.7 0.0 - 1.0 K/UL    ABS. EOSINOPHILS 0.1 0.0 - 0.4 K/UL    ABS. BASOPHILS 0.0 0.0 - 0.1 K/UL   METABOLIC PANEL, COMPREHENSIVE   Result Value Ref Range    Sodium 136 136 - 145 MMOL/L    Potassium 3.6 3.5 - 5.1 MMOL/L    Chloride 102 97 - 108 MMOL/L    CO2 23 21 - 32 MMOL/L    Anion gap 11 5 - 15 mmol/L    Glucose 199 (H) 65 - 100 MG/DL    BUN 6 6 - 20 MG/DL    Creatinine 0.42 (L) 0.45 - 1.15 MG/DL    BUN/Creatinine ratio 14 12 - 20      GFR est AA >60 >60 ml/min/1.77m    GFR est non-AA >60 >60 ml/min/1.782m   Calcium 9.3 8.5 -  10.1 MG/DL    Bilirubin, total 0.7 0.2 - 1.0 MG/DL    ALT 20 12 - 78 U/L    AST 15 15 - 37 U/L    Alk. phosphatase 101 50 - 136 U/L    Protein, total 8.5 (H) 6.4 - 8.2 g/dL    Albumin 4.3 3.5 - 5.0 g/dL    Globulin 4.2 (H) 2.0 - 4.0 g/dL    A-G Ratio 1.0 (L) 1.1 - 2.2     URINALYSIS W/ REFLEX CULTURE   Result Value Ref Range    Color YELLOW     Appearance CLEAR     Specific gravity 1.009 1.003 - 1.030      pH (UA) 5.5 5.0 - 8.0      Protein NEGATIVE  NEGATIVE MG/DL    Glucose 100 (A) NEGATIVE MG/DL    Ketone 15 (A) NEGATIVE MG/DL    Bilirubin NEGATIVE  NEGATIVE    Blood NEGATIVE  NEGATIVE    Urobilinogen 0.2 0.2 - 1.0 EU/DL    Nitrites NEGATIVE  NEGATIVE    Leukocyte Esterase NEGATIVE  NEGATIVE    UA:UC IF INDICATED CULTURE NOT INDICATED BY UA RESULT     WBC 0-4 0 - 4 /HPF    RBC 0-3 0 - 5 /HPF    Epithelial cells 0-5 0 - 5 /LPF    Bacteria NEGATIVE  NEGATIVE /HPF    Hyaline Cast 0-2 0 - 2   LIPASE   Result Value Ref Range     Lipase 69 (L) 73 - 393 U/L   AMYLASE   Result Value Ref Range    Amylase 24 (L) 25 - 115 U/L   EKG, 12 LEAD, INITIAL   Result Value Ref Range    Ventricular Rate 89 BPM    Atrial Rate 89 BPM    P-R Interval 142 ms    QRS Duration 66 ms    Q-T Interval 370 ms    QTC Calculation (Bezet) 450 ms    Calculated P Axis 74 degrees    Calculated R Axis 1 degrees    Calculated T Axis 51 degrees    Diagnosis       Normal sinus rhythm  No previous ECGs available  Confirmed by Sheryle Hail, MD, Ricka Burdock [phone]) on 08/20/2011 10:41:31 PM       Physical Exam:     Visit Vitals   ??? BP 94/68   ??? Pulse 90   ??? SpO2 96%     General:  Alert, cooperative, no distress.   Head:  Normocephalic, without obvious abnormality, atraumatic.   Eyes:  Conjunctivae/corneas clear.   Lungs:  Heart:   Non labored breathing  Regular rate and rhythm, no carotid bruits   Abdomen:   Soft, non-distended   Extremities: Extremities normal, atraumatic, no cyanosis or edema.   Pulses: 2+ and symmetric all extremities.   Skin: Skin color, texture, turgor normal. No rashes or lesions.  Neurologic Exam     Gen: Attention normal             Language: naming, repetition, fluency normal             Memory: intact recent and remote memory  Cranial Nerves:  I: smell Not tested   II: visual fields Full to confrontation   II: pupils Equal, round, reactive to light   II: optic disc No papilledema   III,VII: ptosis none   III,IV,VI: extraocular muscles  Full ROM   V: mastication normal  V: facial light touch sensation  normal   VII: facial muscle function   symmetric   VIII: hearing symmetric   IX: soft palate elevation  normal   XI: trapezius strength  5/5   XI: sternocleidomastoid strength 5/5   XI: neck flexion strength  5/5   XII: tongue  midline     Motor: normal bulk and tone, no tremor              Strength: 5/5 all four extremities  Sensory: intact to LT, PP, vibration, and JPS  Reflexes: 2+ throughout; Down going toes  Coordination: Good FTN and HTS, Romberg negative   Gait: normal gait including tandem            Impression:     Shirley Bass is a 59 y.o. female who  has a past medical history of Depression; Diabetes ([place]); Headache; Hypertension; Stool color black; and Stroke ([place]). who comes in with multiple concerns. First, 2 yrs ago apparently had a stroke in [place] that caused vertigo and had vestibular therapy with benefit. Patient also has history of  hemiplegic migraine in the 90's but now having only R parietal and bilateral temple headache, daily, lasting the whole day, Tylenol helps, associated with nausea consistent with migraine, without visual auras, intractable. Lastly, patient having cognitive issues, described as not remembering what she is supposed to do. Patient should be evaluated for vascular, metabolic and nutritional causes of her symptoms.    RECOMMENDATIONS  1. MRI brain looking for new stroke  2. Blood work for TSH, Vit B12, Folate, RPR, ESR, ANA  3. Will send for vestibular rehab which helped in the past  4. Depakote ER 500 mg QHS for migraine prophylaxis and can help with mood  5. Tylenol prn for headache. Triptans relatively contraindicated due to history of stroke  6. Already on ASA 81 for stroke prevention (although patient was not taking any meds for 1 yr due to insurance issues)  7. Optimize medical management of HTN and DM    Follow-up Disposition:  Return in about 1 month (around 02/02/2015).        Thank you for the consultation      Shelbie Hutching, MD  Diplomate, American Board of Psychiatry and Neurology  Diplomate, Neuromuscular Medicine  Diplomate, American Board of Electrodiagnostic Medicine    Greater than 50% of time spent counseling patient      CC: Jillyn Hidden, [place]  Fax: [phone]

## 2015-01-10 ENCOUNTER — Inpatient Hospital Stay: Admit: 2015-01-10 | Payer: PRIVATE HEALTH INSURANCE | Attending: Neurology | Primary: Physician Assistant

## 2015-01-10 DIAGNOSIS — R9089 Other abnormal findings on diagnostic imaging of central nervous system: Secondary | ICD-10-CM

## 2015-01-11 LAB — VITAMIN B12 & FOLATE
Folate: >20 ng/mL (ref >3.0)
Vitamin B12: 407 pg/mL (ref 211–946)

## 2015-01-11 LAB — TSH 3RD GENERATION: TSH: 2.76 u[IU]/mL (ref 0.450–4.500)

## 2015-01-31 ENCOUNTER — Ambulatory Visit: Admit: 2015-01-31 | Payer: PRIVATE HEALTH INSURANCE | Attending: Neurology | Primary: Physician Assistant

## 2015-01-31 DIAGNOSIS — G43909 Migraine, unspecified, not intractable, without status migrainosus: Secondary | ICD-10-CM

## 2015-01-31 MED ORDER — DIVALPROEX 500 MG TAB, DELAYED RELEASE
500 mg | ORAL_TABLET | Freq: Every evening | ORAL | 5 refills | Status: AC
Start: 2015-01-31 — End: ?

## 2015-01-31 NOTE — Patient Instructions (Signed)
PRESCRIPTION REFILL POLICY    [facility]   Statement to Patients  May 04, 2012      In an effort to ensure the large volume of patient prescription refills is processed in the most efficient and expeditious manner, we are asking our patients to assist us by calling your Pharmacy for all prescription refills, this will include also your  Mail Order Pharmacy. The pharmacy will contact our office electronically to continue the refill process.    Please do not wait until the last minute to call your pharmacy. We need at least 48 hours (2days) to fill prescriptions. We also encourage you to call your pharmacy before going to pick up your prescription to make sure it is ready.     With regard to controlled substance prescription refill requests (narcotic refills) that need to be picked up at our office, we ask your cooperation by providing us with at least 72 hours (3days) notice that you will need a refill.    We will not refill narcotic prescription refill requests after 4:00pm on any weekday, Monday through Thursday, or after 2:00pm on Fridays, or on the weekends.      We encourage everyone to explore another way of getting your prescription refill request processed using MyChart, our patient web portal through our electronic medical record system. MyChart is an efficient and effective way to communicate your medication request directly to the office and  downloadable as an app on your smart phone . MyChart also features a review functionality that allows you to view your medication list as well as leave messages for your physician. Are you ready to get connected? If so please review the attatched instructions or speak to any of our staff to get you set up right away!    Thank you so much for your cooperation. Should you have any questions please contact our Practice Administrator.    The Physicians and Staff,  [facility]

## 2015-01-31 NOTE — Progress Notes (Signed)
Neurology Progress Note    Patient ID:  Shirley Bass  [id number]  59 y.o.  08-27-1955      Subjective:   History:  Shirley Bass is a 59 y.o. female who  has a past medical history of Depression; Diabetes ([place]); Headache; Hypertension; Stool color black; and Stroke ([place]). who comes in with multiple concerns. First, 2 yrs ago apparently had a stroke in [place] that caused vertigo and had vestibular therapy with benefit. Patient also has history of  hemiplegic migraine in the 90's but now having only R parietal and bilateral temple headache, daily, lasting the whole day, Tylenol helps, associated with nausea consistent with migraine, without visual auras, intractable. Lastly, patient having cognitive issues, described as not remembering what she is supposed to do. Patient should be evaluated for vascular, metabolic and nutritional causes of her symptoms.  ??  Patient started Vestibular therapy with some benefit. Has not started Depakote and still getting headaches.  Celesta Gentile recently passed with increased stress.  ??      Objective:   ROS:  Per HPI-  Otherwise 12 point ROS was negative    Meds:  Current Outpatient Prescriptions on File Prior to Visit   Medication Sig Dispense Refill   ??? aspirin delayed-release 81 mg tablet Take  by mouth daily.     ??? lisinopril (PRINIVIL, ZESTRIL) 20 mg tablet Take  by mouth daily.     ??? DULoxetine (CYMBALTA) 60 mg capsule Take 60 mg by mouth daily.     ??? b complex vitamins (B COMPLEX 1) tablet Take 1 Tab by mouth daily.     ??? glimepiride (AMARYL) 4 mg tablet Take  by mouth every morning.     ??? acetaminophen (TYLENOL EXTRA STRENGTH) 500 mg tablet Take  by mouth every six (6) hours as needed for Pain.     ??? ondansetron hcl (ZOFRAN) 8 mg tablet Take 1 Tab by mouth every eight (8) hours as needed for Nausea. 20 Tab 0   ??? ondansetron (ZOFRAN ODT) 8 mg disintegrating tablet Take 1 Tab by mouth every eight (8) hours as needed for Nausea. 15 Tab 0    ??? simvastatin (ZOCOR) 40 mg tablet Take  by mouth nightly.       ??? multivitamin (ONE A DAY) tablet Take 1 Tab by mouth daily.       ??? cyanocobalamin (VITAMIN B-12) 500 mcg tablet Take 500 mcg by mouth daily.       ??? calcium citrate 200 mg (950 mg) tablet Take  by mouth daily.       ??? buPROPion XL (WELLBUTRIN XL) 300 mg XL tablet Take 300 mg by mouth every morning.     ??? HYDROcodone-acetaminophen (NORCO) 7.5-325 mg per tablet Take 1 Tab by mouth every six (6) hours as needed for Pain. 30 Tab 0   ??? metFORMIN (GLUCOPHAGE) 1,000 mg tablet Take 1,000 mg by mouth two (2) times daily (with meals).       ??? citalopram (CELEXA) 40 mg tablet Take 40 mg by mouth daily.         No current facility-administered medications on file prior to visit.        Imaging:    CT Results (recent):    Results from Hospital Encounter encounter on 08/20/11   CT ABD PELV W CONT   Narrative **Final Report**      ICD Codes / Adm.Diagnosis: 110002   / Abdominal Pain    Examination:  CT ABD PELVIS W  CON  - [id number] - Aug 20 2011  6:00PM  Accession No:  [id number]  Reason:  abdominal pain      REPORT:  EXAM:  CT ABD PELVIS W CON    INDICATION:  abdominal pain    COMPARISON: None.    TECHNIQUE:  Multislice helical CT was performed from the diaphragm to the pubic   symphysis following  uneventful rapid bolus intravenous administration of   100 mL of Optiray 350. Oral contrast was also administered.  Contiguous 5 mm   axial images were reconstructed and lung and soft tissue windows were   generated. Coronal and sagittal reformations were generated.    FINDINGS:  The visualized portions of the lung bases are clear.    There is diffuse hypodensity of the liver, consistent with hepatic   steatosis. There are no focal abnormalities within the liver, spleen,   pancreas, or adrenal glands.There is a subcentimeter hypodense right renal   lesion which is too small to characterize. The appearance on the reformatted    images is most consistent with a cyst. There are no solid renal masses.   There is no hydronephrosis. The gallbladder appears unremarkable. There is   no biliary dilatation. The patient has undergone previous gastric bypass   surgery. The postoperative appearance is unremarkable.. The aorta tapers   without aneurysm. There is no retroperitoneal adenopathy or mass. There is a   fat density focus in the ascending colon which may represent a small lipoma   or adherent lipid density material adjacent to the wall. This is   insignificant. The colon otherwise appears unremarkable.. The appendix is   normal. There is no ascites or free intraperitoneal air.    The uterus and adnexal regions are unremarkable.   There is no pelvic mass   or adenopathy.    Postsurgical changes with hardware are seen at L4-5. There are degenerative   changes at the thoracolumbar junction.       IMPRESSION:  1. No acute findings in the abdomen or pelvis.  2. Previous gastric bypass surgery and lower lumbar spine surgery.  3. Hepatic steatosis.             Signing/Reading Doctor: Dalphine Handing [phone])    Approved: Dalphine Handing [phone])  Aug 20 2011  6:25PM                                   MRI Results (recent):    Results from Hospital Encounter encounter on 01/10/15   MRI BRAIN WO CONT   Narrative EXAM:  MRI BRAIN WO CONT  INDICATION:  history of stroke, R 51, headache, intractable persistent migraine,  G 43.512, memory deficit, R 41.3, vertigo, progressive over last month.  TECHNIQUE: Sagittal T1, axial FLAIR, T2,T1 and gradient echo images as well as  coronal T2 weighted images and axial diffusion weighted images of the head were  obtained.  COMPARISON:  None available  FINDINGS:  The ventricular size and configuration are within normal limits.   Scattered small foci of T2 hyperintensity in cerebral white matter. No  associated abnormal restricted diffusion. This is a nonspecific pattern possibly   related to chronic small vessel type ischemic change.  No evidence of hemorrhage, mass, infarct or abnormal extra-axial fluid  collections.   Flow voids are present in the vertebral basilar and carotid artery systems.   The craniocervical junction is normal.  The structures at the cranial base including paranasal sinuses are unremarkable.         Impression IMPRESSION:   1. Mild nonspecific white matter T2 hyperintensity possibly related to chronic  small vessel ischemic change.  2. No acute intracranial abnormality demonstrated.          IR Results (recent):  No results found for this or any previous visit.    VAS/US Results (recent):  No results found for this or any previous visit.    Reviewed [facility] and media tab today    Lab Review     Office Visit on 01/03/2015   Component Date Value Ref Range Status   ??? Vitamin B12 01/10/2015 407  211 - 946 pg/mL Final   ??? Folate 01/10/2015 >20.0  >3.0 ng/mL Final    Comment: A serum folate concentration of less than 3.1 ng/mL is  considered to represent clinical deficiency.     ??? TSH 01/10/2015 2.760  0.450 - 4.500 uIU/mL Final         Exam:  Visit Vitals   ??? BP 106/70   ??? Pulse (!) 107   ??? SpO2 98%     Gen: Awake, alert, follows commands  Appropriate appearance, normal speech.  Oriented to all spheres.  No visual field defect on confrontation exam.  Full eyes movement, with no nystagmus, no diplopia, no ptosis.  Normal gag and swallow.  All remaining cranial nerves were normal  Motor function: 5/5 in all extremities  Sensory: intact to LT, PP and JPS  Good FTN and HTS   Gait: Normal    Assessment:     1. Migraine syndrome            Plan:   1.    RECOMMENDATIONS  1. Follow up blood work for RPR, ESR, ANA  2. Continue vestibular rehab which helped in the past  3. Start Depakote ER 500 mg QHS for migraine prophylaxis and can help with mood  4. Tylenol prn for headache. Triptans relatively contraindicated due to history of stroke   5. Already on ASA 81 for stroke prevention (although patient was not taking any meds for 1 yr due to insurance issues)  6. Optimize medical management of HTN and DM      Follow-up Disposition:  Return in about 2 months (around 04/03/2015).          Shelbie Hutching, MD  Diplomate, American Board of Psychiatry and Neurology  Diplomate, Neuromuscular Medicine  Diplomate, American Board of Electrodiagnostic Medicine

## 2015-04-04 ENCOUNTER — Encounter: Attending: Neurology | Primary: Physician Assistant

## 2015-09-10 IMAGING — MG MM SCREENING BREAST TOMO BILATERAL
12 series · 12 of 12 positions shown · non-contrast
Comparison: None.

CLINICAL DATA: Screening.

EXAM:
DIGITAL SCREENING BILATERAL MAMMOGRAM WITH 3D TOMO WITH CAD

[L CC]
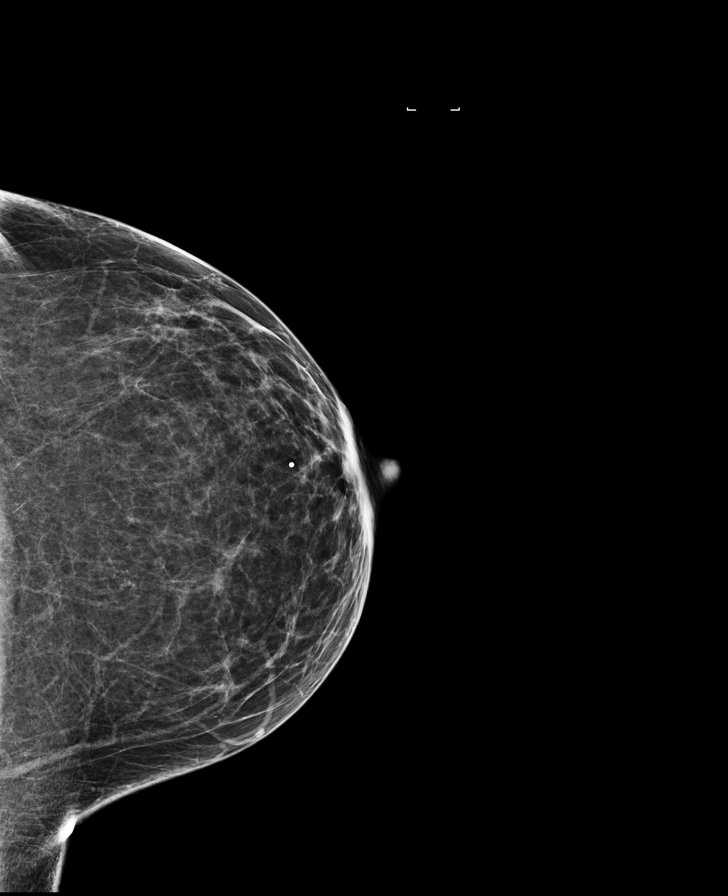

[R MLO synth-2D]
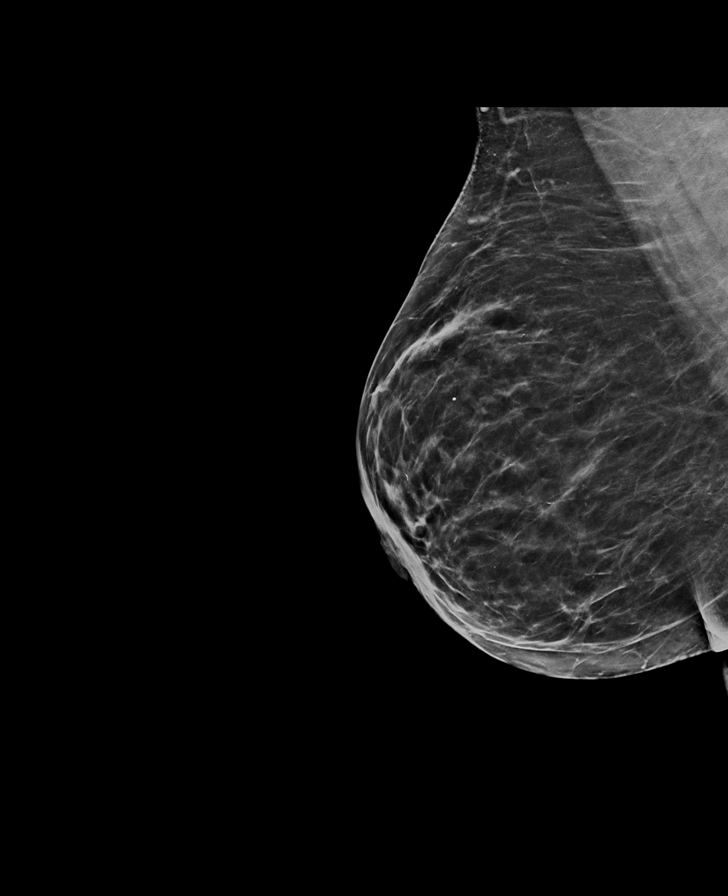

[L CC synth-2D]
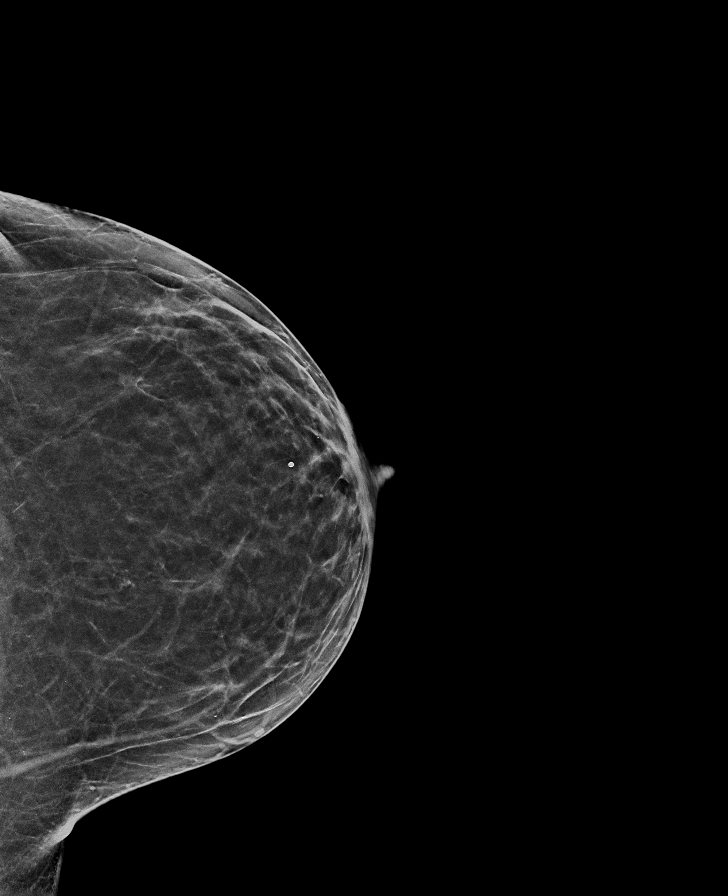

[L MLO synth-2D]
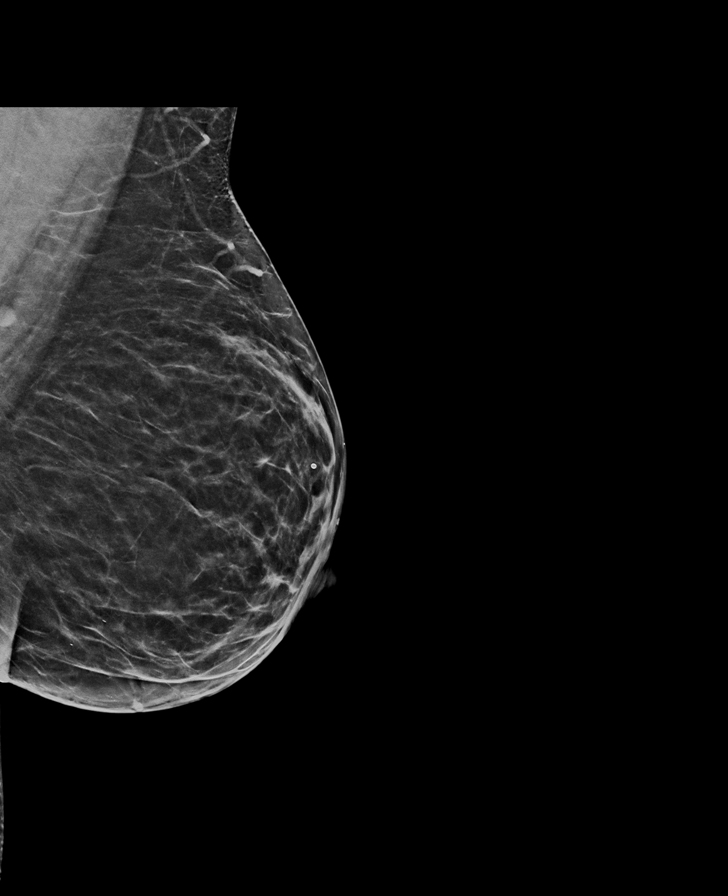

[R MLO]
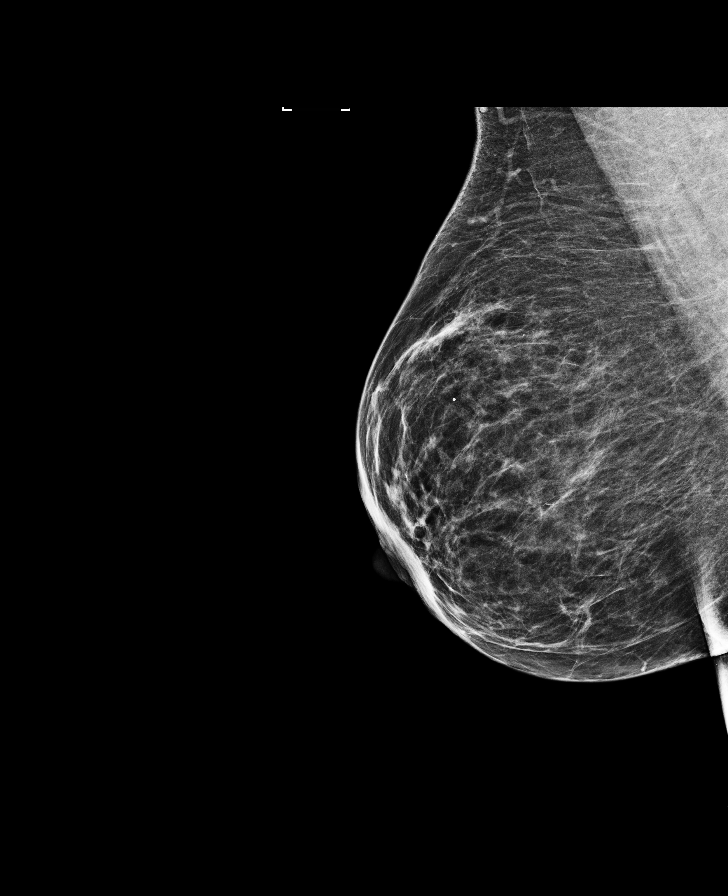

[L MLO]
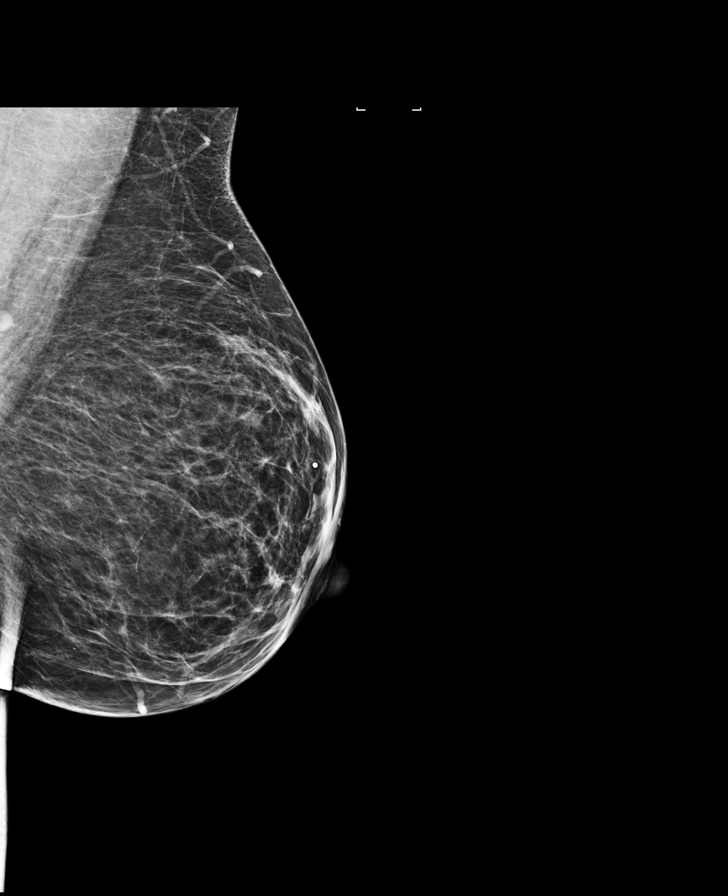

[R CC synth-2D]
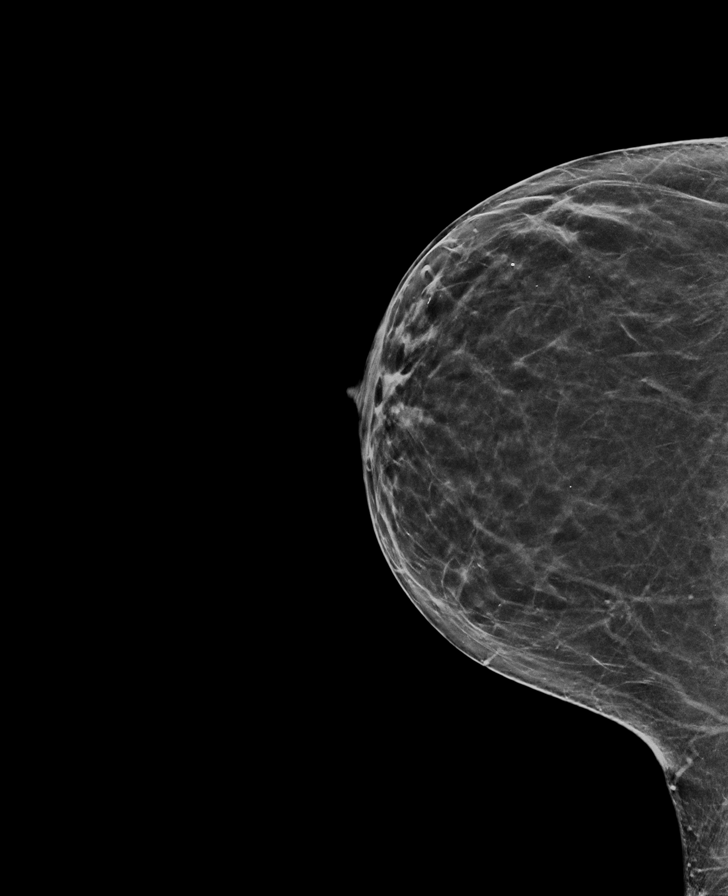

[R CC]
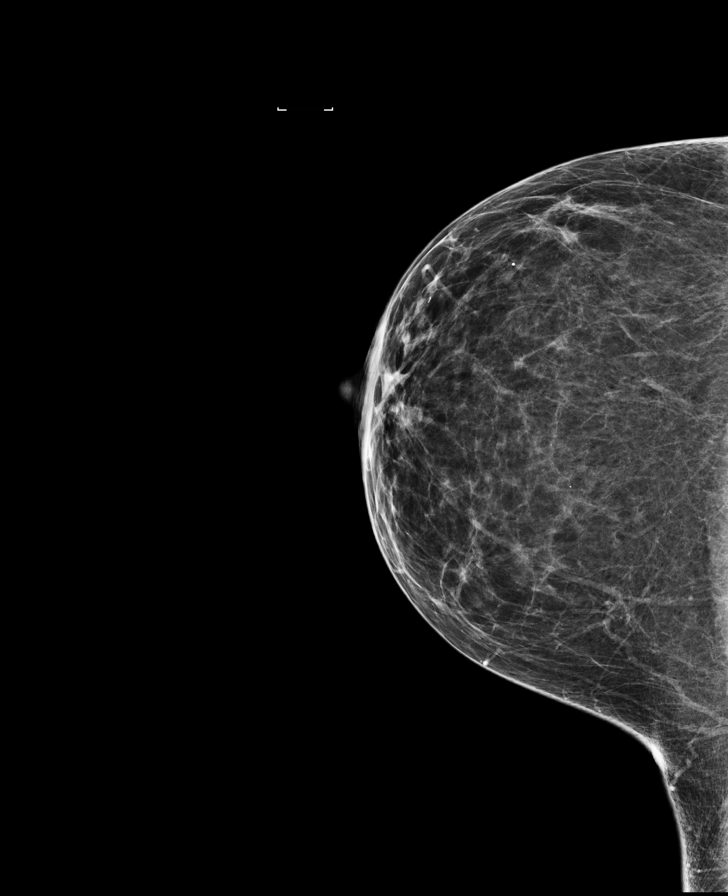

[R MLO tomo]
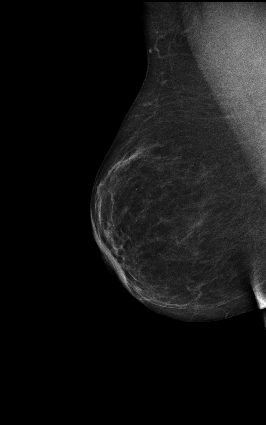

[L CC tomo]
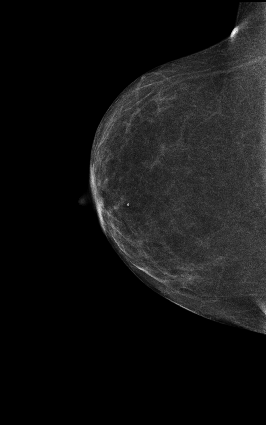

[R CC tomo]
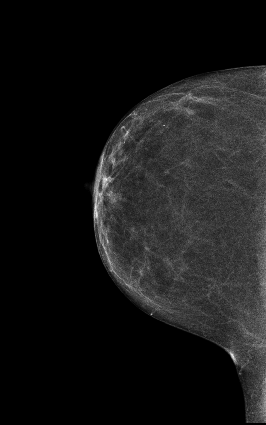

[L MLO tomo]
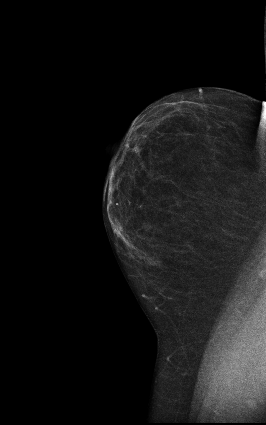

[12 of 12 positions shown; findings below may reference images not displayed]

ACR Breast Density Category b: There are scattered areas of
fibroglandular density.
FINDINGS: There are no findings suspicious for malignancy. Images were
processed with CAD.
IMPRESSION: No mammographic evidence of malignancy. A result letter of this
screening mammogram will be mailed directly to the patient.

RECOMMENDATION:
Screening mammogram in one year. (Code:2K-Q-[id number])

BI-RADS CATEGORY  1: Negative.

## 2017-05-11 ENCOUNTER — Encounter

## 2017-05-11 ENCOUNTER — Inpatient Hospital Stay: Admit: 2017-05-11 | Payer: BLUE CROSS/BLUE SHIELD | Attending: Family | Primary: Physician Assistant

## 2017-05-11 DIAGNOSIS — R600 Localized edema: Secondary | ICD-10-CM

## 2019-12-20 ENCOUNTER — Encounter: Admit: 2019-12-20 | Discharge: 2019-12-20 | Payer: PRIVATE HEALTH INSURANCE | Primary: Physician Assistant

## 2019-12-22 ENCOUNTER — Encounter: Admit: 2019-12-22 | Discharge: 2019-12-22 | Payer: PRIVATE HEALTH INSURANCE | Primary: Physician Assistant

## 2019-12-26 ENCOUNTER — Encounter: Admit: 2019-12-26 | Discharge: 2019-12-26 | Payer: PRIVATE HEALTH INSURANCE | Primary: Physician Assistant

## 2019-12-30 ENCOUNTER — Encounter: Admit: 2019-12-30 | Discharge: 2019-12-30 | Payer: PRIVATE HEALTH INSURANCE | Primary: Physician Assistant
